# Patient Record
Sex: Female | Born: 2000 | Race: Black or African American | Hispanic: No | Marital: Single | Smoking: Never smoker
Health system: Southern US, Community
[De-identification: ages and names within clinical notes are randomized; demographics above are authoritative.]

## PROBLEM LIST (undated history)

## (undated) DIAGNOSIS — R51 Headache: Secondary | ICD-10-CM

## (undated) DIAGNOSIS — K2960 Other gastritis without bleeding: Secondary | ICD-10-CM

## (undated) DIAGNOSIS — E669 Obesity, unspecified: Secondary | ICD-10-CM

## (undated) DIAGNOSIS — Z9989 Dependence on other enabling machines and devices: Secondary | ICD-10-CM

## (undated) HISTORY — DX: Obesity, unspecified: E66.9

## (undated) HISTORY — DX: Headache: R51

## (undated) HISTORY — PX: NO PAST SURGERIES: SHX2092

## (undated) HISTORY — PX: MANIPULATION ANKLE: SUR840

## (undated) HISTORY — DX: Other gastritis without bleeding: K29.60

---
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
## 2020-12-04 ENCOUNTER — Other Ambulatory Visit: Payer: Medicaid Other

## 2020-12-12 ENCOUNTER — Other Ambulatory Visit: Payer: Medicaid Other

## 2020-12-12 DIAGNOSIS — Z20822 Contact with and (suspected) exposure to covid-19: Secondary | ICD-10-CM

## 2020-12-16 LAB — NOVEL CORONAVIRUS, NAA: SARS-CoV-2, NAA: NOT DETECTED

## 2021-09-05 ENCOUNTER — Emergency Department (HOSPITAL_COMMUNITY): Payer: Medicaid Other

## 2021-09-05 ENCOUNTER — Emergency Department (HOSPITAL_COMMUNITY)
Admission: EM | Admit: 2021-09-05 | Discharge: 2021-09-05 | Disposition: A | Discharge status: Home / Self Care | Payer: Medicaid Other | Attending: Emergency Medicine | Admitting: Emergency Medicine

## 2021-09-05 ENCOUNTER — Other Ambulatory Visit: Payer: Self-pay

## 2021-09-05 ENCOUNTER — Encounter (HOSPITAL_COMMUNITY): Payer: Self-pay | Admitting: Emergency Medicine

## 2021-09-05 DIAGNOSIS — W1839XA Other fall on same level, initial encounter: Secondary | ICD-10-CM | POA: Diagnosis not present

## 2021-09-05 DIAGNOSIS — S89301A Unspecified physeal fracture of lower end of right fibula, initial encounter for closed fracture: Secondary | ICD-10-CM | POA: Insufficient documentation

## 2021-09-05 DIAGNOSIS — Z91040 Latex allergy status: Secondary | ICD-10-CM | POA: Insufficient documentation

## 2021-09-05 DIAGNOSIS — Y929 Unspecified place or not applicable: Secondary | ICD-10-CM | POA: Insufficient documentation

## 2021-09-05 DIAGNOSIS — S9304XA Dislocation of right ankle joint, initial encounter: Secondary | ICD-10-CM | POA: Insufficient documentation

## 2021-09-05 DIAGNOSIS — Z79899 Other long term (current) drug therapy: Secondary | ICD-10-CM | POA: Diagnosis not present

## 2021-09-05 DIAGNOSIS — W19XXXA Unspecified fall, initial encounter: Secondary | ICD-10-CM

## 2021-09-05 DIAGNOSIS — S99911A Unspecified injury of right ankle, initial encounter: Secondary | ICD-10-CM | POA: Diagnosis present

## 2021-09-05 DIAGNOSIS — Y939 Activity, unspecified: Secondary | ICD-10-CM | POA: Diagnosis not present

## 2021-09-05 DIAGNOSIS — Y999 Unspecified external cause status: Secondary | ICD-10-CM | POA: Diagnosis not present

## 2021-09-05 DIAGNOSIS — S82831A Other fracture of upper and lower end of right fibula, initial encounter for closed fracture: Secondary | ICD-10-CM

## 2021-09-05 MED ORDER — PROPOFOL 10 MG/ML IV BOLUS
100.0000 mg | Freq: Once | INTRAVENOUS | Status: AC
  Administered 2021-09-05: 25 mg via INTRAVENOUS
  Filled 2021-09-05: qty 20

## 2021-09-05 MED ORDER — HYDROCODONE-ACETAMINOPHEN 5-325 MG PO TABS
1.0000 | ORAL_TABLET | Freq: Once | ORAL | Status: AC
  Administered 2021-09-05: 1 via ORAL
  Filled 2021-09-05: qty 1

## 2021-09-05 MED ORDER — OXYCODONE-ACETAMINOPHEN 5-325 MG PO TABS: 1.0000 | ORAL_TABLET | Freq: Four times a day (QID) | ORAL | 0 refills | Status: DC | PRN

## 2021-09-05 MED ORDER — MORPHINE SULFATE (PF) 4 MG/ML IV SOLN
4.0000 mg | Freq: Once | INTRAVENOUS | Status: AC
  Administered 2021-09-05: 4 mg via INTRAVENOUS
  Filled 2021-09-05: qty 1

## 2021-09-05 MED ORDER — PROPOFOL 10 MG/ML IV BOLUS
INTRAVENOUS | Status: AC | PRN
  Administered 2021-09-05 (×3): 25 mg via INTRAVENOUS

## 2021-09-05 MED ORDER — SODIUM CHLORIDE 0.9 % IV SOLN
INTRAVENOUS | Status: AC | PRN
  Administered 2021-09-05: 1000 mL via INTRAVENOUS

## 2021-09-05 MED ORDER — FREE SPIRIT KNEE/LEG WALKER MISC
0 refills | Status: DC
Start: 2021-09-05 — End: 2024-02-17

## 2021-09-05 MED ORDER — KETAMINE HCL 50 MG/5ML IJ SOSY
100.0000 mg | PREFILLED_SYRINGE | Freq: Once | INTRAMUSCULAR | Status: AC
  Administered 2021-09-05: 75 mg via INTRAVENOUS
  Filled 2021-09-05: qty 10

## 2021-09-05 MED ORDER — ONDANSETRON HCL 4 MG/2ML IJ SOLN
4.0000 mg | Freq: Once | INTRAMUSCULAR | Status: AC
  Administered 2021-09-05: 4 mg via INTRAVENOUS
  Filled 2021-09-05: qty 2

## 2021-09-05 MED ORDER — HYDROCODONE-ACETAMINOPHEN 5-325 MG PO TABS: 1.0000 | ORAL_TABLET | ORAL | 0 refills | Status: DC | PRN

## 2021-09-05 MED ORDER — ONDANSETRON 4 MG PO TBDP: 4.0000 mg | ORAL_TABLET | Freq: Three times a day (TID) | ORAL | 0 refills | Status: DC | PRN

## 2021-09-05 MED ORDER — MORPHINE SULFATE (PF) 4 MG/ML IV SOLN
4.0000 mg | Freq: Once | INTRAVENOUS | Status: AC
  Administered 2021-09-05: 4 mg via INTRAMUSCULAR
  Filled 2021-09-05: qty 1

## 2021-09-05 MED ORDER — KETAMINE HCL 10 MG/ML IJ SOLN
INTRAMUSCULAR | Status: AC | PRN
  Administered 2021-09-05: 25 mg via INTRAVENOUS

## 2021-09-05 MED ORDER — SODIUM CHLORIDE 0.9 % IV BOLUS
500.0000 mL | Freq: Once | INTRAVENOUS | Status: AC
  Administered 2021-09-05: 500 mL via INTRAVENOUS

## 2021-09-05 NOTE — ED Triage Notes (Signed)
Pt BIB [facility], c/o right ankle pain and swelling after a fall today.

## 2021-09-05 NOTE — Sedation Documentation (Signed)
Consent signed.

## 2021-09-05 NOTE — Sedation Documentation (Signed)
X-ray at bedside

## 2021-09-05 NOTE — Progress Notes (Signed)
Orthopedic Tech Progress Note Patient Details:  Beverly Hawkins 23-Apr-2001 [id number]  Applied short leg splint with stirrups to RLE after conscious sedation/reduction of Rt ankle. Britni, PA-C molded splint in desired position until fiberglass had setup.   Ortho Devices Type of Ortho Device: Stirrup splint, Post (short leg) splint Ortho Device/Splint Location: RLE Ortho Device/Splint Interventions: Ordered, Application, Adjustment   Post Interventions Patient Tolerated: Fair Instructions Provided: Care of device  Kal Chait Carmine Savoy 09/05/2021, 7:11 PM

## 2021-09-05 NOTE — Sedation Documentation (Signed)
Family updated as to patient's status.

## 2021-09-05 NOTE — Sedation Documentation (Signed)
ED Provider, RT, ortho, pharmacy, PA, and RN at bedside.

## 2021-09-05 NOTE — ED Provider Notes (Addendum)
[facility] EMERGENCY DEPARTMENT Provider Note   CSN: [id number] Arrival date & time: 09/05/21  1525    History Chief Complaint  Patient presents with   Ankle Pain    Beverly Hawkins is a 20 y.o. female with a past medical history who presents for ration of right ankle and right knee pain after fall.  Denies hitting head, LOC or anticoagulation.  Was getting dressed when she lost her balance felt a pop sensation to her right ankle.  Since she is unable to ambulate.  Pain located to her right lateral ankle as well as her knee.  She denies any proximal, midline tib-fib pain.  No paresthesias or weakness.  She has noted diffuse swelling.  Rates her pain a 10/10.  has not taken anything for symptoms.  Last p.o. intake 1 PM.  Denies chance of pregnancy.  History obtained from patient and past medical records.  No interpreter used  HPI     Past Medical History:  Diagnosis Date   Headache(784.0)    Obesity    Reflux gastritis     Patient Active Problem List   Diagnosis Date Noted   Migraine without aura and without status migrainosus, not intractable 05/01/2013   Morbid obesity (HCC) 05/01/2013    Past Surgical History:  Procedure Laterality Date   NO PAST SURGERIES       OB History   No obstetric history on file.     Family History  Problem Relation Age of Onset   Bipolar disorder Mother    Schizophrenia Paternal Grandmother     Social History   Tobacco Use   Smoking status: Never   Smokeless tobacco: Never  Substance Use Topics   Alcohol use: No   Drug use: No    Home Medications Prior to Admission medications   Medication Sig Start Date End Date Taking? Authorizing Provider  HYDROcodone-acetaminophen (NORCO/VICODIN) 5-325 MG tablet Take 1 tablet by mouth every 4 (four) hours as needed. 09/05/21  Yes Tanis Hensarling A, PA-C  Misc. Devices (FREE SPIRIT KNEE/LEG WALKER) MISC Use for ambulation 09/05/21  Yes Dorotea Hand A, PA-C   ondansetron (ZOFRAN ODT) 4 MG disintegrating tablet Take 1 tablet (4 mg total) by mouth every 8 (eight) hours as needed for nausea or vomiting. 09/05/21  Yes Ricke Kimoto A, PA-C  tobramycin (TOBREX) 0.3 % ophthalmic solution Place 1 drop every 6 (six) hours into the right eye. 10/22/17   Mardella Layman, MD    Allergies    Latex  Review of Systems   Review of Systems  Constitutional:  Negative for activity change, appetite change, chills, diaphoresis, fatigue and fever.  HENT: Negative.    Eyes:  Negative for pain and visual disturbance.  Respiratory: Negative.    Cardiovascular: Negative.   Gastrointestinal: Negative.   Genitourinary: Negative.   Musculoskeletal:        Right ankle pain  Skin: Negative.   Neurological: Negative.   All other systems reviewed and are negative.  Physical Exam Updated Vital Signs BP 120/72 (BP Location: Right Arm)   Pulse 77   Temp 98.8 F (37.1 C) (Oral)   Resp 16   Wt (!) 145.2 kg   SpO2 94%   Physical Exam Vitals and nursing note reviewed.  Constitutional:      General: She is not in acute distress.    Appearance: She is well-developed. She is not ill-appearing, toxic-appearing or diaphoretic.  HENT:     Head: Normocephalic and atraumatic.  Nose: Nose normal.     Mouth/Throat:     Mouth: Mucous membranes are moist.  Eyes:     Pupils: Pupils are equal, round, and reactive to light.  Cardiovascular:     Rate and Rhythm: Normal rate.     Pulses: Normal pulses.     Heart sounds: Normal heart sounds.  Pulmonary:     Effort: Pulmonary effort is normal. No respiratory distress.     Breath sounds: Normal breath sounds.  Abdominal:     General: Bowel sounds are normal. There is no distension.     Palpations: Abdomen is soft.  Musculoskeletal:        General: Normal range of motion.     Cervical back: Normal range of motion.     Comments: Obvious deformity to right ankle.  Wiggles toes without difficulty.  No midline, proximal  tib-fib pain.  Able to flex at her right knee.  Nontender.  No obvious patellar dislocation.  Pelvis stable, nontender.  Nontender femur  Skin:    General: Skin is warm and dry.     Capillary Refill: Capillary refill takes less than 2 seconds.     Comments: No erythema, warmth.  Diffuse swelling to right ankle.  Neurological:     General: No focal deficit present.     Mental Status: She is alert and oriented to person, place, and time.     Comments: Intact sensation right foot  Psychiatric:        Mood and Affect: Mood normal.   ED Results / Procedures / Treatments   Labs (all labs ordered are listed, but only abnormal results are displayed) Labs Reviewed - No data to display  EKG None  Radiology DG Ankle Complete Right  Result Date: 09/05/2021 CLINICAL DATA:  Status post reduction EXAM: RIGHT ANKLE - COMPLETE 3+ VIEW COMPARISON:  From earlier in the same day. FINDINGS: Oblique fracture is again identified through the distal fibula. The tibiotalar joint has been reduced. Generalized soft tissue swelling is noted. No new fracture is seen. IMPRESSION: Status post reduction of the tibiotalar dislocation. Persistent distal fibular fracture. Electronically Signed   By: Alcide Clever M.D.   On: 09/05/2021 19:15   DG Ankle Complete Right  Result Date: 09/05/2021 CLINICAL DATA:  Fall.  Pain. EXAM: RIGHT ANKLE - COMPLETE 3+ VIEW COMPARISON:  07/05/2011 FINDINGS: There is an oblique displaced fracture of the LATERAL malleolus. There is an acute dislocation of the tibiotalar joint. Posterior and MEDIAL malleoli are intact. Note is made of os trigonum. IMPRESSION: Acute fracture dislocation of the RIGHT ankle. Electronically Signed   By: Norva Pavlov M.D.   On: 09/05/2021 16:43   DG Knee Complete 4 Views Right  Result Date: 09/05/2021 CLINICAL DATA:  Fall EXAM: RIGHT KNEE - COMPLETE 4 VIEW COMPARISON:  None. FINDINGS: No evidence of fracture, dislocation, or joint effusion. No evidence of  arthropathy or other focal bone abnormality. Soft tissues are unremarkable. IMPRESSION: No acute osseous abnormality. Electronically Signed   By: Allegra Lai M.D.   On: 09/05/2021 16:43    Procedures Reduction of dislocation  Date/Time: 09/05/2021 7:00 PM Performed by: Linwood Dibbles, PA-C Authorized by: Linwood Dibbles, PA-C  Consent: Verbal consent obtained. Written consent obtained. Risks and benefits: risks, benefits and alternatives were discussed Consent given by: patient Patient understanding: patient states understanding of the procedure being performed Patient consent: the patient's understanding of the procedure matches consent given Procedure consent: procedure consent matches procedure scheduled Relevant documents:  relevant documents present and verified Test results: test results available and properly labeled Site marked: the operative site was marked Imaging studies: imaging studies available Required items: required blood products, implants, devices, and special equipment available Patient identity confirmed: verbally with patient and arm band Time out: Immediately prior to procedure a "time out" was called to verify the correct patient, procedure, equipment, support staff and site/side marked as required. Preparation: Patient was prepped and draped in the usual sterile fashion. Local anesthesia used: no  Anesthesia: Local anesthesia used: no  Sedation: Patient sedated: yes (See attending note for sedation)  Patient tolerance: patient tolerated the procedure well with no immediate complications   .Splint Application  Date/Time: 09/05/2021 7:00 PM Performed by: Ralph Leyden A, PA-C Authorized by: Linwood Dibbles, PA-C   Consent:    Consent obtained:  Verbal   Consent given by:  Patient   Risks, benefits, and alternatives were discussed: yes     Risks discussed:  Numbness, swelling, discoloration and pain   Alternatives discussed:  No  treatment, delayed treatment, alternative treatment, observation and referral Universal protocol:    Procedure explained and questions answered to patient or proxy's satisfaction: yes     Relevant documents present and verified: yes     Test results available: yes     Imaging studies available: yes     Required blood products, implants, devices, and special equipment available: yes     Site/side marked: yes     Immediately prior to procedure a time out was called: yes     Patient identity confirmed:  Verbally with patient and arm band Pre-procedure details:    Distal neurologic exam:  Normal   Distal perfusion: distal pulses strong and brisk capillary refill   Procedure details:    Location:  Ankle   Ankle location:  R ankle   Strapping: yes     Cast type:  Short leg   Splint type:  Short leg and ankle stirrup   Supplies:  Plaster, elastic bandage, fiberglass and cotton padding   Attestation: Splint applied and adjusted personally by me   Post-procedure details:    Distal neurologic exam:  Normal   Distal perfusion: distal pulses strong and brisk capillary refill     Procedure completion:  Tolerated well, no immediate complications   Post-procedure imaging: reviewed     Medications Ordered in ED Medications  morphine 4 MG/ML injection 4 mg (4 mg Intravenous Given 09/05/21 1727)  ondansetron (ZOFRAN) injection 4 mg (4 mg Intravenous Given 09/05/21 1725)  sodium chloride 0.9 % bolus 500 mL (0 mLs Intravenous Stopped 09/05/21 1824)  ketamine 50 mg in normal saline 5 mL (10 mg/mL) syringe (75 mg Intravenous Given 09/05/21 1831)  propofol (DIPRIVAN) 10 mg/mL bolus/IV push 100 mg (25 mg Intravenous Given 09/05/21 1833)  propofol (DIPRIVAN) 10 mg/mL bolus/IV push (25 mg Intravenous Given 09/05/21 1842)  0.9 %  sodium chloride infusion (0 mLs Intravenous Stopped 09/05/21 2030)  ketamine (KETALAR) injection (25 mg Intravenous Given 09/05/21 1847)  HYDROcodone-acetaminophen (NORCO/VICODIN) 5-325  MG per tablet 1 tablet (1 tablet Oral Given 09/05/21 2012)  morphine 4 MG/ML injection 4 mg (4 mg Intramuscular Given 09/05/21 2037)   ED Course  I have reviewed the triage vital signs and the nursing notes.  Pertinent labs & imaging results that were available during my care of the patient were reviewed by me and considered in my medical decision making (see chart for details).   Here for evaluation of fall.  She is afebrile, nonseptic, not ill-appearing.  Pain to primarily right ankle however some mild knee pain.  She is obvious deformity to ankle however is neurovascularly intact.  No evidence of open fractures.  Compartments soft however does have some moderate swelling to right ankle.  No obvious head injury.  No midline CTL tenderness.  X-ray here personally reviewed interpreted shows fracture/dislocation right ankle X-ray knee without any significant abnormality.  Nontender mid, proximal tib-fib.  Patient will need reduction, splinting, close follow-up with Ortho.  Successful reduction of dislocation.  See procedure note. See attending note for sedation documentation. Still has fibular fracture.  Neurovascularly intact after splint placed.  Patient was provided with crutches, pain management.  She will need to follow-up with orthopedics for definitive management.  I discussed this plan with patient and mother in room.  Also discussed return precautions.  Both voiced understanding.  The patient has been appropriately medically screened and/or stabilized in the ED. I have low suspicion for any other emergent medical condition which would require further screening, evaluation or treatment in the ED or require inpatient management.  Patient is hemodynamically stable and in no acute distress.  Patient able to ambulate in department prior to ED.  Evaluation does not show acute pathology that would require ongoing or additional emergent interventions while in the emergency department or further  inpatient treatment.  I have discussed the diagnosis with the patient and answered all questions.  Pain is been managed while in the emergency department and patient has no further complaints prior to discharge.  Patient is comfortable with plan discussed in room and is stable for discharge at this time.  I have discussed strict return precautions for returning to the emergency department.  Patient was encouraged to follow-up with PCP/specialist refer to at discharge.   ADDEND: Pharmacy did not have patients Rx in stock, wrote for different meds, canceled previous narcotic rx.     MDM Rules/Calculators/A&P                            Final Clinical Impression(s) / ED Diagnoses Final diagnoses:  Dislocation of right ankle joint, initial encounter  Fall, initial encounter  Closed fracture of distal end of right fibula, unspecified fracture morphology, initial encounter    Rx / DC Orders ED Discharge Orders          Ordered    oxyCODONE-acetaminophen (PERCOCET/ROXICET) 5-325 MG tablet  Every 6 hours PRN,   Status:  Discontinued        09/05/21 1940    ondansetron (ZOFRAN ODT) 4 MG disintegrating tablet  Every 8 hours PRN        09/05/21 1940    Misc. Devices (FREE SPIRIT KNEE/LEG WALKER) MISC        09/05/21 2017    HYDROcodone-acetaminophen (NORCO/VICODIN) 5-325 MG tablet  Every 4 hours PRN        09/05/21 2107             Shanie Mauzy A, PA-C 09/05/21 1948    Rick Carruthers A, PA-C 09/05/21 2108    Derwood Kaplan, MD 09/06/21 1420

## 2021-09-05 NOTE — ED Provider Notes (Signed)
Emergency Medicine Provider Triage Evaluation Note  Beverly Hawkins , a 20 y.o. female  was evaluated in triage.  Pt complains of right ankle and right knee pain after a fall.  She states that she was trying to put on spanks when she lost her balance and fell and felt a popping sensation to the right knee and right ankle.  She has been unable to ambulate since.  She rates her pain a 10/10 in severity.   Review of Systems  Positive:  Negative: See above   Physical Exam  There were no vitals taken for this visit. Gen:   Awake, no distress   Resp:  Normal effort  MSK:   Moves extremities without difficulty  Other:  Tenderness and swelling over the right ankle.  Dorsalis pedis pulses 2+ with Doppler  Medical Decision Making  Medically screening exam initiated at 3:28 PM.  Appropriate orders placed.  [name] was informed that the remainder of the evaluation will be completed by another provider, this initial triage assessment does not replace that evaluation, and the importance of remaining in the ED until their evaluation is complete.     Honor Loh [place], PA-C 09/05/21 1531    Derwood Kaplan, MD 09/06/21 1350

## 2021-09-05 NOTE — Discharge Instructions (Addendum)
It was our pleasure taking care of you here in the emergency department today  Your x-ray here showed that you have broken your fibula which is a bone on the outside of your ankle as well as dislocated your ankle.  Thankfully we were able to place your ankle back into place and reduce your dislocation.  You still have a fracture of your ankle.  As discussed in the room you will need to follow-up with orthopedics for this.  I have placed the contact information to the on-call orthopedic surgeon in your discharge paperwork.  Please call on Monday to schedule an appointment.  Let them know you were seen here in the emergency department as they typically will follow-up with you quicker.  I have written you for a short course of some pain medicine.  Please take as prescribed.  Do not drive or operate heavy machinery while taking this medication.  This medication may make you sleepy.  This medication is also narcotic only take as needed as this can become addictive.  Have also written you for some Zofran which is a nausea medicine as some pain medications make people nauseous.    Use caution as the Percocets have Tylenol in them as well.  Do not take any additional Tylenol while taking this medication.  You may take ibuprofen.  Do not take more than 2400 mg daily.  Make sure to ice and elevate your leg.  Some swelling is expected.  If you notice any numbness, tingling, severe increase in pain, color changes to your foot please seek reevaluation emergency department otherwise follow-up with orthopedics.

## 2021-09-06 NOTE — ED Provider Notes (Signed)
  Physical Exam  BP 120/72 (BP Location: Right Arm)   Pulse 77   Temp 98.8 F (37.1 C) (Oral)   Resp 16   Wt (!) 145.2 kg   SpO2 94%   Physical Exam  ED Course/Procedures     .Sedation  Date/Time: 09/05/2021 6:25 PM Performed by: Derwood Kaplan, MD Authorized by: Derwood Kaplan, MD   Consent:    Consent obtained:  Written   Consent given by:  Patient   Risks discussed:  Allergic reaction, prolonged hypoxia resulting in organ damage, dysrhythmia, prolonged sedation necessitating reversal, inadequate sedation, respiratory compromise necessitating ventilatory assistance and intubation, nausea and vomiting Universal protocol:    Procedure explained and questions answered to patient or proxy's satisfaction: yes     Relevant documents present and verified: yes     Test results available: yes     Imaging studies available: yes     Required blood products, implants, devices, and special equipment available: yes     Site/side marked: yes     Immediately prior to procedure, a time out was called: yes     Patient identity confirmed:  Arm band Indications:    Procedure performed:  Fracture reduction   Procedure necessitating sedation performed by:  Physician performing sedation Pre-sedation assessment:    Time since last food or drink:  5 hours   ASA classification: class 2 - patient with mild systemic disease     Mouth opening:  3 or more finger widths   Thyromental distance:  4 finger widths   Mallampati score:  II - soft palate, uvula, fauces visible   Neck mobility: normal     Pre-sedation assessments completed and reviewed: airway patency, cardiovascular function, hydration status, mental status, nausea/vomiting, pain level and respiratory function     Pre-sedation assessment completed:  09/05/2021 6:20 PM Immediate pre-procedure details:    Reassessment: Patient reassessed immediately prior to procedure     Reviewed: vital signs     Verified: bag valve mask available,  emergency equipment available, intubation equipment available, IV patency confirmed and oxygen available   Procedure details (see MAR for exact dosages):    Preoxygenation:  Room air   Sedation:  Ketamine and propofol   Intended level of sedation: deep   Intra-procedure monitoring:  Continuous capnometry, blood pressure monitoring, frequent LOC assessments, cardiac monitor, continuous pulse oximetry and frequent vital sign checks   Intra-procedure events: none     Total Provider sedation time (minutes):  30 Post-procedure details:    Post-sedation assessment completed:  09/05/2021 8:05 PM   Attendance: Constant attendance by certified staff until patient recovered     Recovery: Patient returned to pre-procedure baseline     Post-sedation assessments completed and reviewed: airway patency, cardiovascular function, mental status and respiratory function     Patient is stable for discharge or admission: yes     Procedure completion:  Tolerated well, no immediate complications  MDM   Patient was seen by me along with the APP.  I was present for the reduction of the ankle and sedation in its entirety.  X-rays were independently reviewed by me.  Post reduction films look satisfactory.        Derwood Kaplan, MD 09/06/21 [phone]

## 2021-09-09 ENCOUNTER — Encounter (HOSPITAL_COMMUNITY): Payer: Self-pay | Admitting: Orthopedic Surgery

## 2021-09-09 NOTE — Progress Notes (Signed)
DUE TO COVID-19 ONLY ONE VISITOR IS ALLOWED TO COME WITH YOU AND STAY IN THE WAITING ROOM ONLY DURING PRE OP AND PROCEDURE DAY OF SURGERY.   PCP -   Norva Riffle, PA-C Cardiologist - n/a  Chest x-ray - n/a EKG - n/a Stress Test - n/a ECHO - n/a Cardiac Cath - n/a  ICD Pacemaker/Loop - n/a  Sleep Study -  n/a CPAP - none  ERAS: Clear liquid til 8 am DOS.  Clear liquids reviewed with patient.  Anesthesia review: Yes  STOP now taking any Aspirin (unless otherwise instructed by your surgeon), Aleve, Naproxen, Ibuprofen, Motrin, Advil, Goody's, BC's, all herbal medications, fish oil, and all vitamins.   Coronavirus Screening Covid test n/a Ambulatory Surgery   Do you have any of the following symptoms:  Cough yes/no: No Fever (>100.95F)  yes/no: No Runny nose yes/no: No Sore throat yes/no: No Difficulty breathing/shortness of breath  yes/no: No  Have you traveled in the last 14 days and where? yes/no: No  Patient verbalized understanding of instructions that were given via phone.

## 2021-09-11 ENCOUNTER — Ambulatory Visit (HOSPITAL_COMMUNITY)
Admission: RE | Admit: 2021-09-11 | Discharge: 2021-09-11 | Disposition: A | Discharge status: Home / Self Care | Payer: Medicaid Other | Source: Ambulatory Visit | Attending: Orthopedic Surgery | Admitting: Orthopedic Surgery

## 2021-09-11 ENCOUNTER — Other Ambulatory Visit: Payer: Self-pay

## 2021-09-11 ENCOUNTER — Encounter (HOSPITAL_COMMUNITY): Payer: Self-pay | Admitting: Orthopedic Surgery

## 2021-09-11 ENCOUNTER — Ambulatory Visit (HOSPITAL_COMMUNITY): Payer: Medicaid Other | Admitting: Emergency Medicine

## 2021-09-11 ENCOUNTER — Encounter (HOSPITAL_COMMUNITY)
Admission: RE | Disposition: A | Discharge status: Home / Self Care | Payer: Self-pay | Source: Ambulatory Visit | Attending: Orthopedic Surgery

## 2021-09-11 ENCOUNTER — Ambulatory Visit (HOSPITAL_COMMUNITY): Payer: Medicaid Other

## 2021-09-11 DIAGNOSIS — Y9259 Other trade areas as the place of occurrence of the external cause: Secondary | ICD-10-CM | POA: Insufficient documentation

## 2021-09-11 DIAGNOSIS — S93401A Sprain of unspecified ligament of right ankle, initial encounter: Secondary | ICD-10-CM | POA: Insufficient documentation

## 2021-09-11 DIAGNOSIS — S82841A Displaced bimalleolar fracture of right lower leg, initial encounter for closed fracture: Secondary | ICD-10-CM | POA: Diagnosis not present

## 2021-09-11 DIAGNOSIS — X501XXA Overexertion from prolonged static or awkward postures, initial encounter: Secondary | ICD-10-CM | POA: Diagnosis not present

## 2021-09-11 DIAGNOSIS — W19XXXA Unspecified fall, initial encounter: Secondary | ICD-10-CM | POA: Insufficient documentation

## 2021-09-11 DIAGNOSIS — E669 Obesity, unspecified: Secondary | ICD-10-CM | POA: Insufficient documentation

## 2021-09-11 DIAGNOSIS — Z419 Encounter for procedure for purposes other than remedying health state, unspecified: Secondary | ICD-10-CM

## 2021-09-11 DIAGNOSIS — Z6841 Body Mass Index (BMI) 40.0 and over, adult: Secondary | ICD-10-CM | POA: Diagnosis not present

## 2021-09-11 DIAGNOSIS — T148XXA Other injury of unspecified body region, initial encounter: Secondary | ICD-10-CM

## 2021-09-11 HISTORY — PX: ORIF ANKLE FRACTURE: SHX5408

## 2021-09-11 HISTORY — DX: Dependence on other enabling machines and devices: Z99.89

## 2021-09-11 LAB — CBC
HCT: 41.6 % (ref 36.0–46.0)
Hemoglobin: 12.9 g/dL (ref 12.0–15.0)
MCH: 24.8 pg — ABNORMAL LOW (ref 26.0–34.0)
MCHC: 31 g/dL (ref 30.0–36.0)
MCV: 79.8 fL — ABNORMAL LOW (ref 80.0–100.0)
Platelets: 303 10*3/uL (ref 150–400)
RBC: 5.21 MIL/uL — ABNORMAL HIGH (ref 3.87–5.11)
RDW: 15.6 % — ABNORMAL HIGH (ref 11.5–15.5)
WBC: 8.5 10*3/uL (ref 4.0–10.5)
nRBC: 0 % (ref 0.0–0.2)

## 2021-09-11 LAB — POCT PREGNANCY, URINE: Preg Test, Ur: NEGATIVE

## 2021-09-11 SURGERY — OPEN REDUCTION INTERNAL FIXATION (ORIF) ANKLE FRACTURE
Anesthesia: Regional | Site: Ankle | Laterality: Right

## 2021-09-11 MED ORDER — MIDAZOLAM HCL 2 MG/2ML IJ SOLN
INTRAMUSCULAR | Status: AC
  Filled 2021-09-11: qty 2

## 2021-09-11 MED ORDER — HYDROMORPHONE HCL 1 MG/ML IJ SOLN
INTRAMUSCULAR | Status: DC | PRN
  Administered 2021-09-11: .5 mg via INTRAVENOUS

## 2021-09-11 MED ORDER — DEXAMETHASONE SODIUM PHOSPHATE 4 MG/ML IJ SOLN
INTRAMUSCULAR | Status: DC | PRN
  Administered 2021-09-11: 5 mg via INTRAVENOUS

## 2021-09-11 MED ORDER — ORAL CARE MOUTH RINSE: 15.0000 mL | Freq: Once | OROMUCOSAL | Status: AC

## 2021-09-11 MED ORDER — FENTANYL CITRATE (PF) 250 MCG/5ML IJ SOLN
INTRAMUSCULAR | Status: DC | PRN
  Administered 2021-09-11 (×3): 25 ug via INTRAVENOUS
  Administered 2021-09-11: 50 ug via INTRAVENOUS
  Administered 2021-09-11 (×5): 25 ug via INTRAVENOUS

## 2021-09-11 MED ORDER — HYDROMORPHONE HCL 1 MG/ML IJ SOLN
INTRAMUSCULAR | Status: AC
  Filled 2021-09-11: qty 0.5

## 2021-09-11 MED ORDER — 0.9 % SODIUM CHLORIDE (POUR BTL) OPTIME
TOPICAL | Status: DC | PRN
  Administered 2021-09-11: 300 mL

## 2021-09-11 MED ORDER — FENTANYL CITRATE (PF) 100 MCG/2ML IJ SOLN: 100.0000 ug | Freq: Once | INTRAMUSCULAR | Status: AC

## 2021-09-11 MED ORDER — BUPIVACAINE HCL (PF) 0.5 % IJ SOLN
INTRAMUSCULAR | Status: AC
  Filled 2021-09-11: qty 30

## 2021-09-11 MED ORDER — CHLORHEXIDINE GLUCONATE 0.12 % MT SOLN
15.0000 mL | Freq: Once | OROMUCOSAL | Status: AC
  Administered 2021-09-11: 15 mL via OROMUCOSAL
  Filled 2021-09-11: qty 15

## 2021-09-11 MED ORDER — BUPIVACAINE HCL (PF) 0.5 % IJ SOLN
INTRAMUSCULAR | Status: DC | PRN
  Administered 2021-09-11: 20 mL via PERINEURAL

## 2021-09-11 MED ORDER — ONDANSETRON HCL 4 MG/2ML IJ SOLN
INTRAMUSCULAR | Status: DC | PRN
  Administered 2021-09-11: 4 mg via INTRAVENOUS

## 2021-09-11 MED ORDER — PROPOFOL 10 MG/ML IV BOLUS
INTRAVENOUS | Status: DC | PRN
  Administered 2021-09-11: 200 mg via INTRAVENOUS

## 2021-09-11 MED ORDER — OXYCODONE HCL 5 MG PO TABS
5.0000 mg | ORAL_TABLET | Freq: Once | ORAL | Status: AC
  Administered 2021-09-11: 5 mg via ORAL

## 2021-09-11 MED ORDER — CEFAZOLIN IN SODIUM CHLORIDE 3-0.9 GM/100ML-% IV SOLN
3.0000 g | INTRAVENOUS | Status: AC
  Administered 2021-09-11: 3 g via INTRAVENOUS
  Filled 2021-09-11: qty 100

## 2021-09-11 MED ORDER — ACETAMINOPHEN 500 MG PO TABS
1000.0000 mg | ORAL_TABLET | Freq: Once | ORAL | Status: DC
  Filled 2021-09-11: qty 2

## 2021-09-11 MED ORDER — KETAMINE HCL 10 MG/ML IJ SOLN
INTRAMUSCULAR | Status: DC | PRN
  Administered 2021-09-11: 10 mg via INTRAVENOUS
  Administered 2021-09-11 (×2): 20 mg via INTRAVENOUS

## 2021-09-11 MED ORDER — OXYCODONE HCL 5 MG PO TABS
ORAL_TABLET | ORAL | Status: AC
  Filled 2021-09-11: qty 1

## 2021-09-11 MED ORDER — BUPIVACAINE HCL (PF) 0.5 % IJ SOLN
INTRAMUSCULAR | Status: DC | PRN
  Administered 2021-09-11: 15 mL

## 2021-09-11 MED ORDER — MIDAZOLAM HCL 2 MG/2ML IJ SOLN
INTRAMUSCULAR | Status: AC
  Administered 2021-09-11: 2 mg via INTRAVENOUS
  Filled 2021-09-11: qty 2

## 2021-09-11 MED ORDER — DEXAMETHASONE SODIUM PHOSPHATE 10 MG/ML IJ SOLN
INTRAMUSCULAR | Status: DC | PRN
  Administered 2021-09-11: 5 mg via INTRAVENOUS

## 2021-09-11 MED ORDER — FENTANYL CITRATE (PF) 100 MCG/2ML IJ SOLN
25.0000 ug | INTRAMUSCULAR | Status: DC | PRN
  Administered 2021-09-11 (×2): 50 ug via INTRAVENOUS

## 2021-09-11 MED ORDER — PROPOFOL 10 MG/ML IV BOLUS
INTRAVENOUS | Status: AC
  Filled 2021-09-11: qty 20

## 2021-09-11 MED ORDER — MIDAZOLAM HCL 2 MG/2ML IJ SOLN: 2.0000 mg | Freq: Once | INTRAMUSCULAR | Status: AC

## 2021-09-11 MED ORDER — GLYCOPYRROLATE PF 0.2 MG/ML IJ SOSY
PREFILLED_SYRINGE | INTRAMUSCULAR | Status: DC | PRN
  Administered 2021-09-11: .1 mg via INTRAVENOUS

## 2021-09-11 MED ORDER — FENTANYL CITRATE (PF) 100 MCG/2ML IJ SOLN
INTRAMUSCULAR | Status: AC
  Administered 2021-09-11: 100 ug via INTRAVENOUS
  Filled 2021-09-11: qty 2

## 2021-09-11 MED ORDER — BUPIVACAINE LIPOSOME 1.3 % IJ SUSP
INTRAMUSCULAR | Status: DC | PRN
  Administered 2021-09-11: 10 mL via PERINEURAL

## 2021-09-11 MED ORDER — ASPIRIN EC 81 MG PO TBEC: 81.0000 mg | DELAYED_RELEASE_TABLET | Freq: Two times a day (BID) | ORAL | 0 refills | Status: AC

## 2021-09-11 MED ORDER — KETAMINE HCL 50 MG/5ML IJ SOSY
PREFILLED_SYRINGE | INTRAMUSCULAR | Status: AC
  Filled 2021-09-11: qty 5

## 2021-09-11 MED ORDER — LACTATED RINGERS IV SOLN: INTRAVENOUS | Status: DC

## 2021-09-11 MED ORDER — LIDOCAINE 2% (20 MG/ML) 5 ML SYRINGE
INTRAMUSCULAR | Status: DC | PRN
  Administered 2021-09-11: 20 mg via INTRAVENOUS

## 2021-09-11 MED ORDER — OXYCODONE HCL 5 MG PO TABS: 5.0000 mg | ORAL_TABLET | ORAL | 0 refills | Status: AC | PRN

## 2021-09-11 MED ORDER — FENTANYL CITRATE (PF) 100 MCG/2ML IJ SOLN
INTRAMUSCULAR | Status: AC
  Filled 2021-09-11: qty 2

## 2021-09-11 MED ORDER — FENTANYL CITRATE (PF) 250 MCG/5ML IJ SOLN
INTRAMUSCULAR | Status: AC
  Filled 2021-09-11: qty 5

## 2021-09-11 SURGICAL SUPPLY — 88 items
BAG COUNTER SPONGE SURGICOUNT (BAG) ×2 IMPLANT
BAG SPNG CNTER NS LX DISP (BAG) ×1
BANDAGE ESMARK 6X9 LF (GAUZE/BANDAGES/DRESSINGS) ×1 IMPLANT
BIT DRILL 2 CANN GRADUATED (BIT) ×1 IMPLANT
BIT DRILL 2.5 CANN LNG (BIT) ×1 IMPLANT
BIT DRILL 2.5 CANN STRL (BIT) ×1 IMPLANT
BIT DRILL 2.7 (BIT) ×2
BIT DRILL 2.7X2.7/3XSCR ANKL (BIT) IMPLANT
BIT DRL 2.7X2.7/3XSCR ANKL (BIT) ×1
BNDG CMPR 9X6 STRL LF SNTH (GAUZE/BANDAGES/DRESSINGS)
BNDG CMPR MED 15X6 ELC VLCR LF (GAUZE/BANDAGES/DRESSINGS) ×1
BNDG ELASTIC 4X5.8 VLCR STR LF (GAUZE/BANDAGES/DRESSINGS) ×3 IMPLANT
BNDG ELASTIC 6X15 VLCR STRL LF (GAUZE/BANDAGES/DRESSINGS) ×1 IMPLANT
BNDG ESMARK 6X9 LF (GAUZE/BANDAGES/DRESSINGS)
CANISTER SUCT 3000ML PPV (MISCELLANEOUS) ×2 IMPLANT
COVER SURGICAL LIGHT HANDLE (MISCELLANEOUS) ×2 IMPLANT
CUFF TOURN SGL QUICK 34 (TOURNIQUET CUFF)
CUFF TOURN SGL QUICK 42 (TOURNIQUET CUFF) ×1 IMPLANT
CUFF TRNQT CYL 34X4.125X (TOURNIQUET CUFF) IMPLANT
DRAPE C-ARM 42X72 X-RAY (DRAPES) ×1 IMPLANT
DRAPE C-ARMOR (DRAPES) ×1 IMPLANT
DRAPE OEC MINIVIEW 54X84 (DRAPES) ×1 IMPLANT
DRAPE ORTHO SPLIT 77X108 STRL (DRAPES)
DRAPE SURG ORHT 6 SPLT 77X108 (DRAPES) IMPLANT
DRAPE U-SHAPE 47X51 STRL (DRAPES) ×1 IMPLANT
DRSG PAD ABDOMINAL 8X10 ST (GAUZE/BANDAGES/DRESSINGS) ×2 IMPLANT
DURAPREP 26ML APPLICATOR (WOUND CARE) ×1 IMPLANT
ELECT REM PT RETURN 9FT ADLT (ELECTROSURGICAL) ×2
ELECTRODE REM PT RTRN 9FT ADLT (ELECTROSURGICAL) ×1 IMPLANT
GAUZE SPONGE 4X4 12PLY STRL (GAUZE/BANDAGES/DRESSINGS) ×2 IMPLANT
GLOVE SRG 8 PF TXTR STRL LF DI (GLOVE) ×1 IMPLANT
GLOVE SURG ENC MOIS LTX SZ7.5 (GLOVE) ×4 IMPLANT
GLOVE SURG SYN 7.5  E (GLOVE) ×2
GLOVE SURG SYN 7.5 E (GLOVE) ×1 IMPLANT
GLOVE SURG SYN 7.5 PF PI (GLOVE) ×1 IMPLANT
GLOVE SURG UNDER POLY LF SZ7.5 (GLOVE) ×2 IMPLANT
GLOVE SURG UNDER POLY LF SZ8 (GLOVE) ×2
GOWN STRL REUS W/ TWL LRG LVL3 (GOWN DISPOSABLE) ×2 IMPLANT
GOWN STRL REUS W/ TWL XL LVL3 (GOWN DISPOSABLE) ×2 IMPLANT
GOWN STRL REUS W/TWL LRG LVL3 (GOWN DISPOSABLE) ×4
GOWN STRL REUS W/TWL XL LVL3 (GOWN DISPOSABLE) ×4
IMPL TIGHTROP W/DRV K-LESS (Anchor) IMPLANT
IMPLANT TIGHTROPE W/DRV K-LESS (Anchor) ×2 IMPLANT
K-WIRE BB-TAK (WIRE) ×4
KIT BASIN OR (CUSTOM PROCEDURE TRAY) ×2 IMPLANT
KIT TURNOVER KIT B (KITS) ×2 IMPLANT
KWIRE BB-TAK (WIRE) IMPLANT
MANIFOLD NEPTUNE II (INSTRUMENTS) ×2 IMPLANT
NEEDLE 22X1 1/2 (OR ONLY) (NEEDLE) ×2 IMPLANT
NS IRRIG 1000ML POUR BTL (IV SOLUTION) ×2 IMPLANT
PACK ORTHO EXTREMITY (CUSTOM PROCEDURE TRAY) ×2 IMPLANT
PAD ARMBOARD 7.5X6 YLW CONV (MISCELLANEOUS) ×4 IMPLANT
PAD CAST 4YDX4 CTTN HI CHSV (CAST SUPPLIES) ×2 IMPLANT
PADDING CAST ABS 4INX4YD NS (CAST SUPPLIES) ×4
PADDING CAST ABS COTTON 4X4 ST (CAST SUPPLIES) IMPLANT
PADDING CAST COTTON 4X4 STRL (CAST SUPPLIES) ×2
PADDING CAST COTTON 6X4 STRL (CAST SUPPLIES) ×1 IMPLANT
PLATE FIBULA 6 HOLE RT LOCK (Plate) ×1 IMPLANT
SCREW BN T10 FT 20X2.7XST CORT (Screw) IMPLANT
SCREW CORT 2.7X20 (Screw) ×4 IMPLANT
SCREW CORTEX 2.7X24MM LP SS (Screw) ×1 IMPLANT
SCREW LOCK T15 FT 14X3.5XST (Screw) IMPLANT
SCREW LOCKING 2.7X12 ANKLE (Screw) ×1 IMPLANT
SCREW LOCKING 2.7X14MM (Screw) ×2 IMPLANT
SCREW LOCKING 3.5X14MM (Screw) ×4 IMPLANT
SCREW LOW PROFILE 3.5X14 (Screw) ×2 IMPLANT
SCREW TM SS 2.7X14 CORTEX (Screw) ×1 IMPLANT
SPLINT J IMMOBILIZER 4X20FT (CAST SUPPLIES) IMPLANT
SPLINT J PLASTER J 4INX20Y (CAST SUPPLIES) ×3
SPONGE T-LAP 18X18 ~~LOC~~+RFID (SPONGE) ×2 IMPLANT
STRIP CLOSURE SKIN 1/2X4 (GAUZE/BANDAGES/DRESSINGS) ×1 IMPLANT
SUCTION FRAZIER HANDLE 10FR (MISCELLANEOUS) ×2
SUCTION TUBE FRAZIER 10FR DISP (MISCELLANEOUS) ×1 IMPLANT
SUT ETHILON 3 0 FSL (SUTURE) ×1 IMPLANT
SUT MNCRL AB 4-0 PS2 18 (SUTURE) ×1 IMPLANT
SUT MON AB 2-0 CT1 27 (SUTURE) IMPLANT
SUT VIC AB 0 CT1 27 (SUTURE) ×2
SUT VIC AB 0 CT1 27XBRD ANBCTR (SUTURE) IMPLANT
SUT VIC AB 2-0 SH 27 (SUTURE) ×4
SUT VIC AB 2-0 SH 27XBRD (SUTURE) IMPLANT
SYR BULB IRRIG 60ML STRL (SYRINGE) ×2 IMPLANT
SYR CONTROL 10ML LL (SYRINGE) ×1 IMPLANT
TOWEL GREEN STERILE (TOWEL DISPOSABLE) ×2 IMPLANT
TOWEL GREEN STERILE FF (TOWEL DISPOSABLE) ×2 IMPLANT
TUBE CONNECTING 12X1/4 (SUCTIONS) ×2 IMPLANT
UNDERPAD 30X36 HEAVY ABSORB (UNDERPADS AND DIAPERS) ×4 IMPLANT
WATER STERILE IRR 1000ML POUR (IV SOLUTION) ×1 IMPLANT
YANKAUER SUCT BULB TIP NO VENT (SUCTIONS) ×1 IMPLANT

## 2021-09-11 NOTE — Op Note (Signed)
09/11/2021  PATIENT:  Beverly Hawkins    PRE-OPERATIVE DIAGNOSIS:  RIGHT ANKLE FRACTURE  POST-OPERATIVE DIAGNOSIS: Functional bimalleolar ankle fracture with syndesmotic injury  PROCEDURE:  OPEN REDUCTION INTERNAL FIXATION (ORIF) ANKLE FRACTURE  SURGEON:  Alethia Melendrez A Venda Dice, MD  PHYSICIAN ASSISTANT: none ANESTHESIA:   General  ESTIMATED BLOOD LOSS: 50cc  PREOPERATIVE INDICATIONS:  Beverly Hawkins is a  20 y.o. female with a diagnosis of RIGHT ANKLE FRACTURE who elected for surgical management to minimize the risk for malunion and nonunion and post-traumatic arthritis.    The risks benefits and alternatives were discussed with the patient preoperatively including but not limited to the risks of infection, bleeding, nerve injury, cardiopulmonary complications, the need for revision surgery, the need for hardware removal, among others, and the patient was willing to proceed.  OPERATIVE IMPLANTS:  Implant Name Type Inv. Item Serial No. Manufacturer Lot No. LRB No. Used Action  SCREW CORT 2.7X20 - [id number] Screw SCREW CORT 2.7X20  [employer] ON STERILE TRAY Right 2 Implanted  SCREW LOW PROFILE 3.5X14 - [id number] Screw SCREW LOW PROFILE 3.5X14  [employer] ON STERILE TRAY Right 2 Implanted  PLATE FIBULA 6 HOLE RT LOCK - [id number] Plate PLATE FIBULA 6 HOLE RT LOCK  [employer] ON STERILE TRAY Right 1 Implanted  SCREW LOCKING 3.5X14MM - [id number] Screw SCREW LOCKING 3.5X14MM  [employer] ON STERILE TRAY Right 2 Implanted  SCREW LOCKING 2.7X14MM - [id number] Screw SCREW LOCKING 2.7X14MM  [employer] ON STERILE TRAY Right 2 Implanted  SCREW LOCKING 2.7X12 ANKLE - [id number] Screw SCREW LOCKING 2.7X12 ANKLE  [employer] ON STERILE TRAY Right 1 Implanted  IMPLANT TIGHTROPE W/DRV K-LESS - [id number] Anchor IMPLANT TIGHTROPE W/DRV [employer] [id number] Right 1 Implanted    OPERATIVE PROCEDURE: The patient was brought to the operating room and placed in the supine position. All bony prominences  were padded. Non sterile Tourniquet was placed but not inflated. General anesthesia was administered. The lower extremity was prepped and draped in the usual sterile fashion.  Time out was performed.   Incision was made over the distal fibula and the fracture was exposed and reduced anatomically with a clamp. A lag screw was placed anterior to posterior across the fracture.  Did not get a great bite with the first screw so a second lag screw was also placed with this had better fixation and compression of the fracture.  I then applied a distal fibular locking plate and secured the plate proximally with 2 nonlocking screws.  Given the patient's size and bone quality I also used 2 locking screws at the proximal most aspect of the plate.  Distally the plate was secured with 5 unicortical nonlocking screws.  Following fixation of the fibula there was still notable widening of the syndesmosis with no overlap of the tibia and fibula on the mortise view concerning for a syndesmotic disruption.  We elected to use a tight rope to fix his syndesmosis.  A periarticular clamp was placed across the leg anterior medial to posterior lateral with moderate compression until be the stenosis was adequately reduced.  We then placed a tight rope lateral to medial through the plate just proximal to the fracture.  I used c-arm to confirm satisfactory reduction and fixation.   The wounds were irrigated, and closed with 0  vicryl, 2-0 vicryl with nylon closure for the skin. The wounds were injected with local anesthetic. Sterile gauze was applied followed by a short leg splint. She was awakened  and returned to the PACU in stable and satisfactory condition. There were no complications.    Post op recs: WB: NWB RLE in splint until follow up Imaging: PACU xrays DVT prophylaxis: Aspirin 81mg  BID starting POD1 x4 weeks Follow up: 2 weeks after surgery for a wound check with Dr. at [facility].  Address: [address], [place], [place]  Office Phone: [phone]   [phone], MD Orthopaedic Surgery

## 2021-09-11 NOTE — Anesthesia Procedure Notes (Signed)
Procedure Name: Intubation Date/Time: 09/11/2021 11:45 AM Performed by: Phoebe Perch, CRNA Pre-anesthesia Checklist: Patient identified, Emergency Drugs available, Suction available and Patient being monitored Patient Re-evaluated:Patient Re-evaluated prior to induction Oxygen Delivery Method: Circle system utilized Preoxygenation: Pre-oxygenation with 100% oxygen Induction Type: IV induction Ventilation: Mask ventilation without difficulty LMA: LMA inserted LMA Size: 4.0 Tube type: Oral Number of attempts: 1 Airway Equipment and Method: Stylet and Oral airway Placement Confirmation: ETT inserted through vocal cords under direct vision, positive ETCO2 and breath sounds checked- equal and bilateral Tube secured with: Tape Dental Injury: Teeth and Oropharynx as per pre-operative assessment

## 2021-09-11 NOTE — Transfer of Care (Signed)
Immediate Anesthesia Transfer of Care Note  Patient: Beverly Hawkins  Procedure(s) Performed: OPEN REDUCTION INTERNAL FIXATION (ORIF) ANKLE FRACTURE (Right: Ankle)  Patient Location: PACU  Anesthesia Type:General  Level of Consciousness: awake  Airway & Oxygen Therapy: Patient Spontanous Breathing and Patient connected to face mask oxygen  Post-op Assessment: Report given to RN and Post -op Vital signs reviewed and stable  Post vital signs: stable  Last Vitals:  Vitals Value Taken Time  BP 151/137 09/11/21 1351  Temp    Pulse 99 09/11/21 1354  Resp 20 09/11/21 1354  SpO2 100 % 09/11/21 1354  Vitals shown include unvalidated device data.  Last Pain:  Vitals:   09/11/21 1004  TempSrc:   PainSc: 4          Complications: No notable events documented.

## 2021-09-11 NOTE — H&P (Signed)
SUBJECTIVE:   Right ankle injury.  She is a 20 year old female who injured her ankle on 09/05/2021.  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  She was unable to weight bear.  She was seen in the emergency department where x-rays demonstrated an ankle fracture and dislocation.  She was successfully closed reduced and splinted.  No skin injury was noted.  She also reports some mild pain in the right knee.  Otherwise no pain in the hip or back.  She was given hydrocodone, which she has been taking for pain.   Past medical history: Obesity.  No other medical problems.   Current medications:  She does not take other medications.  Social history:  Nonsmoker.  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   Allergies:  No known drug allergies.    OBJECTIVE: Height 5 feet 5 inches, weight 326 pounds.  BMI is 54.  She is unable to ambulate.  She has a short-leg splint on the right lower extremity that is clean, dry and intact.  Her exposed toes are warm and well perfused.  Intact sensation distally.  Unable to demonstrate intact active EHL and FHL.  She has mild tenderness to the right knee, but no appreciable effusion.  Range of motion is 0 to 100.  No pain with hip range of motion.    X-RAYS: New x-rays obtained today in the clinic demonstrate displaced lateral malleolus fracture, as well as medial joint space widening.  Tibiotalar joint is otherwise reduced.    IMPRESSION:  Functional bimalleolar ankle fracture of the right ankle.    PLAN: We discussed with the patient that she has an unstable ankle injury and would benefit from open reduction and internal fixation.  Surgical plan, risks and benefits, postoperative recovery were discussed with the patient.  Risks including infection, nerve injury and bleeding were discussed.  We will plan to do her surgery as an outpatient.  She will be nonweightbearing in a splint postoperatively for  at least two weeks.  She expressed understanding and agreement

## 2021-09-11 NOTE — Anesthesia Procedure Notes (Signed)
Anesthesia Regional Block: Popliteal block   Pre-Anesthetic Checklist: , timeout performed,  Correct Patient, Correct Site, Correct Laterality,  Correct Procedure, Correct Position, site marked,  Risks and benefits discussed,  Surgical consent,  Pre-op evaluation,  At surgeon's request and post-op pain management  Laterality: Right  Prep: Maximum Sterile Barrier Precautions used, chloraprep       Needles:  Injection technique: Single-shot  Needle Type: Echogenic Stimulator Needle     Needle Length: 9cm  Needle Gauge: 22     Additional Needles:   Procedures:,,,, ultrasound used (permanent image in chart),,    Narrative:  Start time: 09/11/2021 11:00 AM End time: 09/11/2021 11:15 AM Injection made incrementally with aspirations every 5 mL.  Performed by: Personally  Anesthesiologist: Elmer Picker, MD  Additional Notes: Monitors applied. No increased pain on injection. No increased resistance to injection. Injection made in 5cc increments. Good needle visualization. Patient tolerated procedure well.

## 2021-09-11 NOTE — Interval H&P Note (Signed)
The risks benefits and alternatives were discussed with the patient including but not limited to the risks of nonoperative treatment, versus surgical intervention including infection, bleeding, nerve injury, malunion, nonunion, the need for revision surgery, hardware prominence, hardware failure, the need for hardware removal, blood clots, cardiopulmonary complications, morbidity, mortality, among others, and they were willing to proceed.  Consent was signed by myself and the patient.  Right leg was marked.      

## 2021-09-11 NOTE — Anesthesia Preprocedure Evaluation (Addendum)
Anesthesia Evaluation  Patient identified by MRN, date of birth, ID band Patient awake    Reviewed: Allergy & Precautions, NPO status , Patient's Chart, lab work & pertinent test results  Airway Mallampati: III  TM Distance: >3 FB Neck ROM: Full    Dental no notable dental hx. (+) Teeth Intact, Dental Advisory Given   Pulmonary neg pulmonary ROS,    Pulmonary exam normal breath sounds clear to auscultation       Cardiovascular negative cardio ROS Normal cardiovascular exam Rhythm:Regular Rate:Normal     Neuro/Psych  Headaches, negative psych ROS   GI/Hepatic Neg liver ROS, GERD  ,  Endo/Other  Morbid obesity (BMI 52)  Renal/GU negative Renal ROS  negative genitourinary   Musculoskeletal negative musculoskeletal ROS (+)   Abdominal   Peds  Hematology negative hematology ROS (+)   Anesthesia Other Findings   Reproductive/Obstetrics                            Anesthesia Physical Anesthesia Plan  ASA: 3  Anesthesia Plan: General and Regional   Post-op Pain Management:  Regional for Post-op pain   Induction: Intravenous  PONV Risk Score and Plan: 3 and Ondansetron, Dexamethasone and Midazolam  Airway Management Planned: LMA  Additional Equipment:   Intra-op Plan:   Post-operative Plan: Extubation in OR  Informed Consent: I have reviewed the patients History and Physical, chart, labs and discussed the procedure including the risks, benefits and alternatives for the proposed anesthesia with the patient or authorized representative who has indicated his/her understanding and acceptance.     Dental advisory given  Plan Discussed with: CRNA  Anesthesia Plan Comments:         Anesthesia Quick Evaluation

## 2021-09-11 NOTE — Discharge Instructions (Signed)
Diet: As you were doing prior to hospitalization   Shower:   If you have a splint on, leave the splint in place and keep the splint dry with a plastic bag.  Dressing:  If you have a splint, then just leave the splint in place and we will change your bandages during your first follow-up appointment.  If water gets in the splint or the splint gets saturated please call the clinic and we can see you to change your splint.   Activity:  Increase activity slowly as tolerated, but follow the weight bearing instructions below.  The rules on driving is that you can not be taking narcotics while you drive, and you must feel in control of the vehicle.    Weight Bearing:   no weight on the right leg.    Blood clot prevention (DVT Prophylaxis): After surgery you are at an increased risk for a blood clot. you were prescribed a blood thinner, Aspirin 81mg , to be taken twice daily for a total of 4 weeks from surgery to help reduce your risk of getting a blood clot. This will help prevent a blood clot. Signs of a pulmonary embolus (blood clot in the lungs) include sudden short of breath, feeling lightheaded or dizzy, chest pain with a deep breath, rapid pulse rapid breathing. Signs of a blood clot in your arms or legs include new unexplained swelling and cramping, warm, red or darkened skin around the painful area. Please call the office or 911 right away if these signs or symptoms develop.  To prevent constipation: you may use a stool softener such as -  Colace (over the counter) 100 mg by mouth twice a day  Drink plenty of fluids (prune juice may be helpful) and high fiber foods Miralax (over the counter) for constipation as needed.    Itching:  If you experience itching with your medications, try taking only a single pain pill, or even half a pain pill at a time.  You may take up to 10 pain pills per day, and you can also use benadryl over the counter for itching or also to help with sleep.   Precautions:  If  you experience chest pain or shortness of breath - call 911 immediately for transfer to the hospital emergency department!!   Call office [phone]) for the following: Temperature greater than 101F Persistent nausea and vomiting Severe uncontrolled pain Redness, tenderness, or signs of infection (pain, swelling, redness, odor or green/yellow discharge around the site) Difficulty breathing, headache or visual disturbances Hives Persistent dizziness or light-headedness Extreme fatigue Any other questions or concerns you may have after discharge  In an emergency, call 911 or go to an Emergency Department at a nearby hospital                                                Follow- Up Appointment:  Please call for an appointment to be seen in 2 weeks [facility] with your surgeon [facility] - [phone]

## 2021-09-13 LAB — NASOPHARYNGEAL CULTURE

## 2021-09-14 ENCOUNTER — Encounter (HOSPITAL_COMMUNITY): Payer: Self-pay | Admitting: Orthopedic Surgery

## 2021-09-14 NOTE — Anesthesia Postprocedure Evaluation (Signed)
Anesthesia Post Note  Patient: Beverly Hawkins  Procedure(s) Performed: OPEN REDUCTION INTERNAL FIXATION (ORIF) ANKLE FRACTURE (Right: Ankle)     Patient location during evaluation: PACU Anesthesia Type: Regional and General Level of consciousness: awake and alert Pain management: pain level controlled Vital Signs Assessment: post-procedure vital signs reviewed and stable Respiratory status: spontaneous breathing, nonlabored ventilation, respiratory function stable and patient connected to nasal cannula oxygen Cardiovascular status: blood pressure returned to baseline and stable Postop Assessment: no apparent nausea or vomiting Anesthetic complications: no   No notable events documented.  Last Vitals:  Vitals:   09/11/21 1405 09/11/21 1420  BP: (!) 129/93 126/90  Pulse: 69 81  Resp: (!) 24 (!) 22  Temp:  (!) 36.4 C  SpO2: 97% 97%    Last Pain:  Vitals:   09/11/21 1420  TempSrc:   PainSc: Asleep                 Yadira Hada L Venita Seng

## 2021-10-22 ENCOUNTER — Ambulatory Visit: Payer: Medicaid Other | Attending: Orthopedic Surgery

## 2021-10-22 ENCOUNTER — Other Ambulatory Visit: Payer: Self-pay

## 2021-10-22 DIAGNOSIS — Z8781 Personal history of (healed) traumatic fracture: Secondary | ICD-10-CM | POA: Insufficient documentation

## 2021-10-22 DIAGNOSIS — R262 Difficulty in walking, not elsewhere classified: Secondary | ICD-10-CM | POA: Diagnosis present

## 2021-10-22 DIAGNOSIS — M25671 Stiffness of right ankle, not elsewhere classified: Secondary | ICD-10-CM | POA: Insufficient documentation

## 2021-10-22 DIAGNOSIS — Z9889 Other specified postprocedural states: Secondary | ICD-10-CM | POA: Diagnosis present

## 2021-10-22 DIAGNOSIS — M6281 Muscle weakness (generalized): Secondary | ICD-10-CM | POA: Insufficient documentation

## 2021-10-24 NOTE — Therapy (Signed)
[facility] [address] [place], [place], [address] Phone: [phone]   Fax:  [phone]  Physical Therapy Evaluation  Patient Details  Name: Beverly Hawkins MRN: [id number] Date of Birth: 03/23/2001 Referring Provider (PT): Joen Laura, MD   Encounter Date: 10/22/2021   PT End of Session - 10/24/21 1446     Visit Number 1    Number of Visits 13    Date for PT Re-Evaluation 12/12/21    Authorization Type [place] [employer]ICA[employer] [place]    Progress Note Due on Visit 10    PT Start Time 1105    PT Stop Time 1150    PT Time Calculation (min) 45 min    Equipment Utilized During Treatment --   crutches, walking boot   Activity Tolerance Patient tolerated treatment well    Behavior During Therapy WFL for tasks assessed/performed             Past Medical History:  Diagnosis Date   Headache(784.0)    Obesity    Reflux gastritis    Walker as ambulation aid    and crutches    Past Surgical History:  Procedure Laterality Date   MANIPULATION ANKLE Right    with sedation   ORIF ANKLE FRACTURE Right 09/11/2021   Procedure: OPEN REDUCTION INTERNAL FIXATION (ORIF) ANKLE FRACTURE;  Surgeon: Joen Laura, MD;  Location: [facility] OR;  Service: Orthopedics;  Laterality: Right;    There were no vitals filed for this visit.    Subjective Assessment - 10/24/21 1505     Subjective Pt reports she broke her R ankle on 10/1 when getting dressed and lost balance and fell. Pt denies pain and reports only low pain when irt occurs    Pertinent History Obesity    Limitations Walking;Standing    Patient Stated Goals To be be able to walk again. To be able to return to work where she goes up and down steps.    Pain Score 0-No pain   0-2/10 pain range   Pain Location Ankle    Pain Orientation Right    Pain Descriptors / Indicators Aching    Pain Type Chronic pain    Pain Onset More than a month ago    Pain Frequency  Occasional    Aggravating Factors  activity level    Pain Relieving Factors rest, elevation                [facility] PT Assessment - 10/24/21 0001       Assessment   Medical Diagnosis right ankle ORIF    Referring Provider (PT) Joen Laura, MD    Onset Date/Surgical Date 09/05/21    Prior Therapy no      Precautions   Precautions Other (comment)   ankle     Restrictions   Weight Bearing Restrictions Yes    RUE Weight Bearing --   25%   Other Position/Activity Restrictions progress weight bearing 25% per week, can DC boot once 100% w/o crutches      Balance Screen   Has the patient fallen in the past 6 months Yes    How many times? 1   loss balance with dressing   Has the patient had a decrease in activity level because of a fear of falling?  No    Is the patient reluctant to leave their home because of a fear of falling?  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     Cognition   Overall Cognitive Status Within Functional Limits for tasks assessed      Observation/Other Assessments   Focus on Therapeutic Outcomes (FOTO)  NA      Observation/Other Assessments-Edema    Edema --   +1 edema R forefoot     Sensation   Light Touch Appears Intact      ROM / Strength   AROM / PROM / Strength AROM;Strength      AROM   AROM Assessment Site Ankle    Right/Left Ankle Right;Left    Right Ankle Dorsiflexion -2    Right Ankle Plantar Flexion 35    Right Ankle Inversion 20    Right Ankle Eversion 19    Left Ankle Dorsiflexion 12    Left Ankle Plantar Flexion 50    Left Ankle Inversion 30    Left Ankle Eversion 28      Strength   Strength Assessment Site Ankle    Right/Left Ankle Right;Left    Right Ankle Dorsiflexion 3+/5    Right Ankle Plantar Flexion 3+/5    Right Ankle  Inversion 3-/5    Right Ankle Eversion 3-/5    Left Ankle Dorsiflexion 5/5    Left Ankle Plantar Flexion 5/5    Left Ankle Inversion 5/5    Left Ankle Eversion 5/5      Transfers   Transfers Sit to Stand;Stand to Sit    Sit to Stand 6: Modified independent (Device/Increase time)   crutches     Ambulation/Gait   Ambulation/Gait Yes    Ambulation/Gait Assistance 6: Modified independent (Device/Increase time)    Ambulation Distance (Feet) 60 Feet    Assistive device Crutches   ahndgrips adjusted for better lift   Gait Pattern Step-to pattern    Gait velocity slow pace    Gait Comments 3 rest breaks to walk 60 ft                        Objective measurements completed on examination: See above findings.    [facility] Adult PT Treatment/Exercise:  Therapeutic Exercise: - R ankle DF/PF x10 - R ankle circumduction x10 each direction - R ankle alphabet 1 set               PT Education - 10/24/21 1445     Education Details Eval findings, POC, HEP, RICE for pain and swelling    Person(s) Educated Patient    Methods Explanation;Demonstration;Tactile cues;Verbal cues;Handout    Comprehension Verbalized understanding;Returned demonstration;Verbal cues required;Tactile cues required              PT Short Term Goals - 10/24/21 1457       PT SHORT TERM GOAL #1   Title Pt will beInd in an initail HEP    Baseline started on eval    Status New    Target Date 11/14/21      PT SHORT TERM GOAL #2   Title Pt will be able to ascend/descend 12 steps c crutches for improved community mobility    Status New    Target Date 11/14/21               PT Long Term Goals - 10/24/21 1459  PT LONG TERM GOAL #1   Title Improve R ankle ROM to wiht 80% of the L for improve functional mobility    Baseline see flowsheets    Status New    Target Date 12/12/21      PT LONG TERM GOAL #2   Title Improve R ankle strength to 4+ for improved functional mobility     Baseline se flowsheets    Status New    Target Date 12/12/21      PT LONG TERM GOAL #3   Title Pt will be able to walk 1052ft s an AD and mild limp over the R LE for community ambulation and return to her job    Status New    Target Date 12/12/21      PT LONG TERM GOAL #4   Title Pt will be able to ascend/desend 24 steps c handrail as needed for community mobility and return to her job    Status New    Target Date 12/12/21      PT LONG TERM GOAL #5   Title Set additional functional goals for the r ankle as indicated    Status New    Target Date 12/12/21                    Plan - 10/24/21 1447     Clinical Impression Statement Pt presents following a R ankle fx requiring ORIF. Pt is currently walking with crutches using a step to pattern at a slow pace c a walking boot. Pt is using the boosting method for steps. The R ankle demonstrates decreased ROM and strength. Per MD instructions, pt was instructed and practiced walking at 255 wt bearing for the R LE. pt was initiated with AROM exs for the R ankle. Pt will benefit from skilled PT 2w6 to address deficits and improve pt's functional mobility.    Personal Factors and Comorbidities Comorbidity 1    Comorbidities obesity    Stability/Clinical Decision Making Stable/Uncomplicated    Clinical Decision Making Low    Rehab Potential Good    PT Frequency 2x / week    PT Duration 6 weeks    PT Treatment/Interventions ADLs/Self Care Home Management;Aquatic Therapy;Cryotherapy;Electrical Stimulation;Ultrasound;Moist Heat;Iontophoresis 4mg /ml Dexamethasone;Gait training;Stair training;Functional mobility training;Therapeutic activities;Therapeutic exercise;Balance training;Neuromuscular re-education;Manual techniques;Patient/family education;Passive range of motion;Dry needling;Taping;Joint Manipulations    PT Next Visit Plan Assess resonse to HEP, assess hip and knee strength,    PT Home Exercise Plan [id number]    Consulted and  Agree with Plan of Care Patient             Patient will benefit from skilled therapeutic intervention in order to improve the following deficits and impairments:  Abnormal gait, Difficulty walking, Decreased range of motion, Decreased activity tolerance, Obesity, Pain, Increased edema, Decreased strength, Decreased balance  Visit Diagnosis: Decreased range of motion of right ankle  Muscle weakness (generalized)  Status post open reduction with internal fixation (ORIF) of fracture of ankle  Difficulty in walking, not elsewhere classified     Problem List Patient Active Problem List   Diagnosis Date Noted   Migraine without aura and without status migrainosus, not intractable 05/01/2013   Morbid obesity (HCC) 05/01/2013   05/03/2013 MS, PT 10/24/21 3:06 PM   [facility] [address] [place], [place], [place] Phone: [phone]   Fax:  [phone]  Name: TREY BEBEE MRN: [name] Date of Birth: 12-14-2000  Check all possible CPT codes:  92446- Therapeutic Exercise, [id number]- Neuro Re-education, [phone] - Gait Training, 17711 - Manual Therapy, [id number] - Therapeutic Activities, [id number] - Self Care, 97014 - Electrical stimulation (unattended), [id number] - Electrical stimulation (Manual), [id number] - Iontophoresis, [id number] - Ultrasound, and 38333 - Vaso

## 2021-11-03 ENCOUNTER — Encounter: Payer: Self-pay | Admitting: Physical Therapy

## 2021-11-03 ENCOUNTER — Other Ambulatory Visit: Payer: Self-pay

## 2021-11-03 ENCOUNTER — Ambulatory Visit: Payer: Medicaid Other | Admitting: Physical Therapy

## 2021-11-03 DIAGNOSIS — Z9889 Other specified postprocedural states: Secondary | ICD-10-CM

## 2021-11-03 DIAGNOSIS — M25671 Stiffness of right ankle, not elsewhere classified: Secondary | ICD-10-CM

## 2021-11-03 DIAGNOSIS — M6281 Muscle weakness (generalized): Secondary | ICD-10-CM

## 2021-11-03 DIAGNOSIS — R262 Difficulty in walking, not elsewhere classified: Secondary | ICD-10-CM

## 2021-11-03 NOTE — Therapy (Signed)
[place], [place], [address] Phone: [phone]   Fax:  [phone]  Physical Therapy Treatment  Patient Details  Name: Beverly Hawkins MRN: [id number] Date of Birth: April 04, 2001 Referring Provider (PT): Willaim Sheng, MD   Encounter Date: 11/03/2021   PT End of Session - 11/03/21 1446     Visit Number 2    Number of Visits 13    Date for PT Re-Evaluation 12/12/21    Authorization Type [place] MEDICAID AMERIHEALTH CARITAS OF     Progress Note Due on Visit 10    PT Start Time 0243    PT Stop Time 0325    PT Time Calculation (min) 42 min             Past Medical History:  Diagnosis Date   Headache(784.0)    Obesity    Reflux gastritis    Walker as ambulation aid    and crutches    Past Surgical History:  Procedure Laterality Date   MANIPULATION ANKLE Right    with sedation   ORIF ANKLE FRACTURE Right 09/11/2021   Procedure: OPEN REDUCTION INTERNAL FIXATION (ORIF) ANKLE FRACTURE;  Surgeon: Willaim Sheng, MD;  Location: [facility];  Service: Orthopedics;  Laterality: Right;    There were no vitals filed for this visit.   Subjective Assessment - 11/03/21 1445     Subjective I am ready to walk.    Currently in Pain? No/denies            [facility] Adult PT Treatment/Exercise:  Therapeutic Exercise: - ankle circles and pumps -seated toe scrunches. - Seated heel raises x 10 -seated toe raises x 10 -seated towel slides Inversion and eversion x 10  x 3 each way  - seated DF stretch with strap -supine SLR x 10 x 2 Side lying hip abduction x 10 x 2    Ambulation/Gait   Ambulation/Gait Yes    Ambulation/Gait Assistance 6: Modified independent (Device/Increase time)    Ambulation Distance (Feet) 60 Feet , 40 feet   Assistive device Crutches   ahndgrips adjusted for better lift   Gait Pattern Step-to pattern    Gait velocity moderate pace    Gait Comments 0 rest breaks to walk 60  ft         PT Education - 11/03/21 1536     Education Details HEP    Person(s) Educated Patient    Methods Explanation;Handout    Comprehension Verbalized understanding              PT Short Term Goals - 11/03/21 1454       PT SHORT TERM GOAL #1   Title Pt will beInd in an initail HEP    Baseline started on eval    Period Weeks    Status On-going      PT SHORT TERM GOAL #2   Title Pt will be able to ascend/descend 12 steps c crutches for improved community mobility    Baseline 11/03/21- can navigate with railing and one crutch - 4 steps    Status Partially Met               PT Long Term Goals - 10/24/21 1459       PT LONG TERM GOAL #1   Title Improve R ankle ROM to wiht 80% of the L for improve functional mobility    Baseline see flowsheets    Status New    Target Date  12/12/21      PT LONG TERM GOAL #2   Title Improve R ankle strength to 4+ for improved functional mobility    Baseline se flowsheets    Status New    Target Date 12/12/21      PT LONG TERM GOAL #3   Title Pt will be able to walk 1054f s an AD and mild limp over the R LE for community ambulation and return to her job    Status New    Target Date 12/12/21      PT LONG TERM GOAL #4   Title Pt will be able to ascend/desend 24 steps c handrail as needed for community mobility and return to her job    Status New    Target Date 12/12/21      PT LONG TERM GOAL #5   Title Set additional functional goals for the r ankle as indicated    Status New    Target Date 12/12/21                   Plan - 11/03/21 1509     Clinical Impression Statement Pt arrives with Bilateral crutches and CAm boot utilizing step to pattern. Able to progress WB to 50% in cam boot with bilateral crutches and step through pattern with intermittent discomfort. reviewed HEP and progressed with hip strength, ankle ROM and ankle AAROM. She was given an updated HEP.    PT Treatment/Interventions ADLs/Self Care  Home Management;Aquatic Therapy;Cryotherapy;Electrical Stimulation;Ultrasound;Moist Heat;Iontophoresis 485mml Dexamethasone;Gait training;Stair training;Functional mobility training;Therapeutic activities;Therapeutic exercise;Balance training;Neuromuscular re-education;Manual techniques;Patient/family education;Passive range of motion;Dry needling;Taping;Joint Manipulations    PT Next Visit Plan Assess resonse to HEP, assess hip and knee strength,    PT Home Exercise Plan [email or web address]             Patient will benefit from skilled therapeutic intervention in order to improve the following deficits and impairments:  Abnormal gait, Difficulty walking, Decreased range of motion, Decreased activity tolerance, Obesity, Pain, Increased edema, Decreased strength, Decreased balance  Visit Diagnosis: Decreased range of motion of right ankle  Muscle weakness (generalized)  Status post open reduction with internal fixation (ORIF) of fracture of ankle  Difficulty in walking, not elsewhere classified     Problem List Patient Active Problem List   Diagnosis Date Noted   Migraine without aura and without status migrainosus, not intractable 05/01/2013   Morbid obesity (HCOmaha05/27/2014    DoDorene ArPTA 11/03/2021, 3:36 PM  [facility]hone: [phone] Fax:  [phone]: Beverly ZAVALETARN: [id number] of Birth: [birth year]

## 2021-11-03 NOTE — Patient Instructions (Signed)
Access Code: [email or web address] URL: [email or web address] Date: 11/03/2021 Prepared by: Jannette Spanner  Exercises Supine Ankle Pumps - 3 x daily - 7 x weekly - 1 sets - 10 reps Supine Ankle Circles - 3 x daily - 7 x weekly - 1 sets - 10 reps Seated Ankle Alphabet - 3 x daily - 7 x weekly - 1 sets - 1 reps Ankle Inversion Eversion Towel Slide - 3 x daily - 7 x weekly - 2 sets - 10 reps Towel Scrunches - 3 x daily - 7 x weekly - 2 sets - 10 reps Seated Calf Stretch with Strap - 3 x daily - 7 x weekly - 2 sets - 10 reps Sidelying Hip Abduction - 1 x daily - 7 x weekly - 2 sets - 10 reps

## 2021-11-05 ENCOUNTER — Encounter: Payer: Self-pay | Admitting: Physical Therapy

## 2021-11-05 ENCOUNTER — Other Ambulatory Visit: Payer: Self-pay

## 2021-11-05 ENCOUNTER — Ambulatory Visit: Payer: Medicaid Other | Attending: Orthopedic Surgery | Admitting: Physical Therapy

## 2021-11-05 DIAGNOSIS — M25671 Stiffness of right ankle, not elsewhere classified: Secondary | ICD-10-CM

## 2021-11-05 DIAGNOSIS — R262 Difficulty in walking, not elsewhere classified: Secondary | ICD-10-CM

## 2021-11-05 DIAGNOSIS — Z9889 Other specified postprocedural states: Secondary | ICD-10-CM | POA: Diagnosis present

## 2021-11-05 DIAGNOSIS — Z8781 Personal history of (healed) traumatic fracture: Secondary | ICD-10-CM | POA: Diagnosis present

## 2021-11-05 DIAGNOSIS — M6281 Muscle weakness (generalized): Secondary | ICD-10-CM | POA: Diagnosis present

## 2021-11-05 NOTE — Therapy (Signed)
[place], [place], [address] Phone: [phone]   Fax:  [phone]  Physical Therapy Treatment  Patient Details  Name: Beverly Hawkins MRN: [id number] Date of Birth: [birth year] Referring Provider (PT): Willaim Sheng, MD   Encounter Date: 11/05/2021   PT End of Session - 11/05/21 1149     Visit Number 3    Number of Visits 13    Date for PT Re-Evaluation 12/12/21    Authorization Type [place] MEDICAID AMERIHEALTH CARITAS OF [place]- no auth for first 12 visits    PT Start Time 8786    PT Stop Time 1230    PT Time Calculation (min) 45 min             Past Medical History:  Diagnosis Date   Headache(784.0)    Obesity    Reflux gastritis    Walker as ambulation aid    and crutches    Past Surgical History:  Procedure Laterality Date   MANIPULATION ANKLE Right    with sedation   ORIF ANKLE FRACTURE Right 09/11/2021   Procedure: OPEN REDUCTION INTERNAL FIXATION (ORIF) ANKLE FRACTURE;  Surgeon: Willaim Sheng, MD;  Location: [facility];  Service: Orthopedics;  Laterality: Right;    There were no vitals filed for this visit.   Subjective Assessment - 11/05/21 1148     Subjective No pain on arrival. Pt reports compliance with HEP.    Currently in Pain? No/denies             [facility] Adult PT Treatment/Exercise:  Gait: in clinic with bilat crutches and cam boot, improved step through pattern x 75 feet.  Therapeutic Exercise: - ankle circles and pumps -seated toe scrunches, toe yoga -seated Baps L2 A/P , inv/EV , then circles , all 10 each  - Seated heel raises x 10- cues for eccentric control  -seated toe raises x 10- cues for eccentric control  -seated towel slides Inversion and eversion x 10  x 3 each way  - supine DF stretch with strap-cues to avoid excessive supination -supine SLR x 10 x 2 Side lying hip abduction x 10 x 2  -prone hip extension 10 x 2 each -yellow band seated 4 way  ankle x 10 each-   -passive ankle stretching - all planes      PT Short Term Goals - 11/03/21 1454       PT SHORT TERM GOAL #1   Title Pt will beInd in an initail HEP    Baseline started on eval    Period Weeks    Status On-going      PT SHORT TERM GOAL #2   Title Pt will be able to ascend/descend 12 steps c crutches for improved community mobility    Baseline 11/03/21- can navigate with railing and one crutch - 4 steps    Status Partially Met               PT Long Term Goals - 10/24/21 1459       PT LONG TERM GOAL #1   Title Improve R ankle ROM to wiht 80% of the L for improve functional mobility    Baseline see flowsheets    Status New    Target Date 12/12/21      PT LONG TERM GOAL #2   Title Improve R ankle strength to 4+ for improved functional mobility    Baseline se flowsheets    Status New  Target Date 12/12/21      PT LONG TERM GOAL #3   Title Pt will be able to walk 1015f s an AD and mild limp over the R LE for community ambulation and return to her job    Status New    Target Date 12/12/21      PT LONG TERM GOAL #4   Title Pt will be able to ascend/desend 24 steps c handrail as needed for community mobility and return to her job    Status New    Target Date 12/12/21      PT LONG TERM GOAL #5   Title Set additional functional goals for the r ankle as indicated    Status New    Target Date 12/12/21                   Plan - 11/05/21 1149     Clinical Impression Statement Pt arrives with bilateral crutches and improved step through pattern with 50% WB in cam boot. Continued with gait in clinic and began Nustep without boot or shoe. Able to tolerate L3 for 3 minutes prior to increasing pain. Began seated BAPS board and yellow band resistance for 4 way ankle strengthening.  Continued with previously prescribed exercises with good tolerance and reviewed newest HEP. Progressed hip strength with prone hip extensions. She was given an updated  HEP. At end of session she reported feeling a little sore.    PT Treatment/Interventions ADLs/Self Care Home Management;Aquatic Therapy;Cryotherapy;Electrical Stimulation;Ultrasound;Moist Heat;Iontophoresis 442mml Dexamethasone;Gait training;Stair training;Functional mobility training;Therapeutic activities;Therapeutic exercise;Balance training;Neuromuscular re-education;Manual techniques;Patient/family education;Passive range of motion;Dry needling;Taping;Joint Manipulations    PT Next Visit Plan Assess resonse to HEP, progress per WB status (currently 50% , can progress to 75% next week)    PT Home Exercise Plan [email or web address]    Consulted and Agree with Plan of Care Patient             Patient will benefit from skilled therapeutic intervention in order to improve the following deficits and impairments:  Abnormal gait, Difficulty walking, Decreased range of motion, Decreased activity tolerance, Obesity, Pain, Increased edema, Decreased strength, Decreased balance  Visit Diagnosis: Decreased range of motion of right ankle  Muscle weakness (generalized)  Status post open reduction with internal fixation (ORIF) of fracture of ankle  Difficulty in walking, not elsewhere classified     Problem List Patient Active Problem List   Diagnosis Date Noted   Migraine without aura and without status migrainosus, not intractable 05/01/2013   Morbid obesity (HCUbly05/27/2014    DoDorene ArPTA 11/05/2021, 12:47 PM  [facility]rPort [place]hone: [phone] Fax:  [phone]: Beverly BENEDICTRN: [id number] of Birth: [birth year]

## 2021-11-10 ENCOUNTER — Other Ambulatory Visit: Payer: Self-pay

## 2021-11-10 ENCOUNTER — Encounter: Payer: Self-pay | Admitting: Physical Therapy

## 2021-11-10 ENCOUNTER — Ambulatory Visit: Payer: Medicaid Other | Admitting: Physical Therapy

## 2021-11-10 DIAGNOSIS — R262 Difficulty in walking, not elsewhere classified: Secondary | ICD-10-CM

## 2021-11-10 DIAGNOSIS — M25671 Stiffness of right ankle, not elsewhere classified: Secondary | ICD-10-CM

## 2021-11-10 DIAGNOSIS — Z9889 Other specified postprocedural states: Secondary | ICD-10-CM

## 2021-11-10 DIAGNOSIS — M6281 Muscle weakness (generalized): Secondary | ICD-10-CM

## 2021-11-10 NOTE — Therapy (Signed)
[place], [place], [address] Phone: [phone]   Fax:  [phone]  Physical Therapy Treatment  Patient Details  Name: Beverly Hawkins MRN: [id number] Date of Birth: 2001-05-30 Referring Provider (PT): Willaim Sheng, MD   Encounter Date: 11/10/2021   PT End of Session - 11/10/21 1240     Visit Number 4    Number of Visits 13    Date for PT Re-Evaluation 12/12/21    Authorization Type [place] MEDICAID AMERIHEALTH CARITAS OF [place]- no auth for first 12 visits    Progress Note Due on Visit 10    PT Start Time 1230    PT Stop Time 1311    PT Time Calculation (min) 41 min             Past Medical History:  Diagnosis Date   Headache(784.0)    Obesity    Reflux gastritis    Walker as ambulation aid    and crutches    Past Surgical History:  Procedure Laterality Date   MANIPULATION ANKLE Right    with sedation   ORIF ANKLE FRACTURE Right 09/11/2021   Procedure: OPEN REDUCTION INTERNAL FIXATION (ORIF) ANKLE FRACTURE;  Surgeon: Willaim Sheng, MD;  Location: [facility];  Service: Orthopedics;  Laterality: Right;    There were no vitals filed for this visit.   Subjective Assessment - 11/10/21 1234     Subjective Only have pain sometimes with stretches at medial ankle. Otherwise no pain walking. Some pain with stairs.    Currently in Pain? No/denies                [facility] PT Assessment - 11/10/21 0001       AROM   Right Ankle Dorsiflexion 2                           [facility] Adult PT Treatment/Exercise - 11/10/21 0001       Ambulation/Gait   Ambulation/Gait Assistance 6: Modified independent (Device/Increase time)    Ambulation Distance (Feet) 180 Feet    Gait Pattern Step-through pattern    Gait velocity moderate pace    Stairs Yes    Stairs Assistance 6: Modified independent (Device/Increase time)    Stair Management Technique Step to pattern    Height of Stairs  6    Gait Comments able to negotiate 4 stairs with 1 crutch and 1 hand rail with step to pattern.             [facility] Adult PT Treatment/Exercise:   Therapeutic Exercise:  -seated toe scrunches, toe yoga -seated Baps L2 A/P , inv/EV , then circles , all 10 each  -Seated prostretch -AAROM  10 sec x 10 reps -seated towel slides Inversion and eversion x 10  x 3 each way  - seated gastroc and soleus stretch with strap- -supine SLR x 10 x 2 Side lying hip abduction x 10 x 2  -prone hip extension 10 x 2 each -red band seated 4 way ankle x 10 each-     Interventions NOT performed today-  - ankle circles and pumps - Seated heel raises x 10- cues for eccentric control  -seated toe raises x 10- cues for eccentric control  -passive ankle stretching - all planes      PT Short Term Goals - 11/03/21 1454       PT SHORT TERM GOAL #1   Title Pt  will beInd in an initail HEP    Baseline started on eval    Period Weeks    Status On-going      PT SHORT TERM GOAL #2   Title Pt will be able to ascend/descend 12 steps c crutches for improved community mobility    Baseline 11/03/21- can navigate with railing and one crutch - 4 steps    Status Partially Met               PT Long Term Goals - 10/24/21 1459       PT LONG TERM GOAL #1   Title Improve R ankle ROM to wiht 80% of the L for improve functional mobility    Baseline see flowsheets    Status New    Target Date 12/12/21      PT LONG TERM GOAL #2   Title Improve R ankle strength to 4+ for improved functional mobility    Baseline se flowsheets    Status New    Target Date 12/12/21      PT LONG TERM GOAL #3   Title Pt will be able to walk 1043f s an AD and mild limp over the R LE for community ambulation and return to her job    Status New    Target Date 12/12/21      PT LONG TERM GOAL #4   Title Pt will be able to ascend/desend 24 steps c handrail as needed for community mobility and return to her job    Status  New    Target Date 12/12/21      PT LONG TERM GOAL #5   Title Set additional functional goals for the r ankle as indicated    Status New    Target Date 12/12/21                   Plan - 11/10/21 1306     Clinical Impression Statement Pt is making great progress with weightbearing progression. Able to transition to 1 crutch in cam boot. Per referral can DC boot once 100% without crutches. Reviewed gait on stair and able to complete safely with 1 crutch and 1 handrail. DF ROM is improving.    PT Treatment/Interventions ADLs/Self Care Home Management;Aquatic Therapy;Cryotherapy;Electrical Stimulation;Ultrasound;Moist Heat;Iontophoresis 456mml Dexamethasone;Gait training;Stair training;Functional mobility training;Therapeutic activities;Therapeutic exercise;Balance training;Neuromuscular re-education;Manual techniques;Patient/family education;Passive range of motion;Dry needling;Taping;Joint Manipulations    PT Next Visit Plan Assess resonse to HEP, progress per WB status (currently 50% , can progress to 75% next week)- pt to bring shoe next visit to begin weight shifting progression    PT Home Exercise Plan [email or web address]             Patient will benefit from skilled therapeutic intervention in order to improve the following deficits and impairments:  Abnormal gait, Difficulty walking, Decreased range of motion, Decreased activity tolerance, Obesity, Pain, Increased edema, Decreased strength, Decreased balance  Visit Diagnosis: Decreased range of motion of right ankle  Muscle weakness (generalized)  Difficulty in walking, not elsewhere classified  Status post open reduction with internal fixation (ORIF) of fracture of ankle     Problem List Patient Active Problem List   Diagnosis Date Noted   Migraine without aura and without status migrainosus, not intractable 05/01/2013   Morbid obesity (HCFirst Mesa05/27/2014    DoDorene ArPTA 11/10/2021, 1:17 PM  [facility]rT[place]hone: [phone] Fax:  [phone]:  JAMILYN PIGEON MRN: [id number] Date of Birth: 07/18/2001

## 2021-11-12 ENCOUNTER — Ambulatory Visit: Payer: Medicaid Other | Admitting: Physical Therapy

## 2021-11-12 ENCOUNTER — Encounter: Payer: Self-pay | Admitting: Physical Therapy

## 2021-11-12 ENCOUNTER — Other Ambulatory Visit: Payer: Self-pay

## 2021-11-12 DIAGNOSIS — M25671 Stiffness of right ankle, not elsewhere classified: Secondary | ICD-10-CM | POA: Diagnosis not present

## 2021-11-12 DIAGNOSIS — M6281 Muscle weakness (generalized): Secondary | ICD-10-CM

## 2021-11-12 DIAGNOSIS — R262 Difficulty in walking, not elsewhere classified: Secondary | ICD-10-CM

## 2021-11-12 DIAGNOSIS — Z8781 Personal history of (healed) traumatic fracture: Secondary | ICD-10-CM

## 2021-11-12 NOTE — Therapy (Signed)
[place], [place], [address] Phone: [phone]   Fax:  [phone]  Physical Therapy Treatment  Patient Details  Name: Beverly Hawkins MRN: [id number] Date of Birth: 2001/06/01 Referring Provider (PT): Willaim Sheng, MD   Encounter Date: 11/12/2021   PT End of Session - 11/12/21 1108     Visit Number 5    Number of Visits 13    Date for PT Re-Evaluation 12/12/21    Authorization Type [place] MEDICAID AMERIHEALTH CARITAS OF - no auth for first 12 visits    Progress Note Due on Visit 10    PT Start Time 1100    PT Stop Time 1145    PT Time Calculation (min) 45 min             Past Medical History:  Diagnosis Date   Headache(784.0)    Obesity    Reflux gastritis    Walker as ambulation aid    and crutches    Past Surgical History:  Procedure Laterality Date   MANIPULATION ANKLE Right    with sedation   ORIF ANKLE FRACTURE Right 09/11/2021   Procedure: OPEN REDUCTION INTERNAL FIXATION (ORIF) ANKLE FRACTURE;  Surgeon: Willaim Sheng, MD;  Location: [facility];  Service: Orthopedics;  Laterality: Right;    There were no vitals filed for this visit.   Subjective Assessment - 11/12/21 1105     Subjective I am doing well with one crutch and CAM boot. Spoke to MD on phone and released back to work , light duty has [employer].    Currently in Pain? No/denies    Pain Score 0-No pain    Pain Location Ankle             [facility] Adult PT Treatment/Exercise:   Therapeutic Exercise: -standing In bilateral shoes: 10 se lateral weight shifts x 10 -sit-stand x 10  -seated toe scrunches, toe yoga - seated gastroc and soleus stretch with strap- -supine SLR x 10 x 2 2# Side lying hip abduction x 10 x 2  2# -prone hip extension 10 x 2 each 2# -red band supine 4 way ankle x 10 each- green for PF    Modalities: Vaso- low compression , 2.5 snowflakes , x 10 minutes   Interventions NOT  performed today-  - ankle circles and pumps - Seated heel raises x 10- cues for eccentric control  -seated toe raises x 10- cues for eccentric control  -passive ankle stretching - all planes --seated towel slides Inversion and eversion x 10  x 3 each way  --Seated prostretch -AAROM  10 sec x 10 reps --seated Baps L2 A/P , inv/EV , then circles , all 10 each        PT Short Term Goals - 11/03/21 1454       PT SHORT TERM GOAL #1   Title Pt will beInd in an initail HEP    Baseline started on eval    Period Weeks    Status On-going      PT SHORT TERM GOAL #2   Title Pt will be able to ascend/descend 12 steps c crutches for improved community mobility    Baseline 11/03/21- can navigate with railing and one crutch - 4 steps    Status Partially Met               PT Long Term Goals - 10/24/21 1459       PT LONG TERM GOAL #  1   Title Improve R ankle ROM to wiht 80% of the L for improve functional mobility    Baseline see flowsheets    Status New    Target Date 12/12/21      PT LONG TERM GOAL #2   Title Improve R ankle strength to 4+ for improved functional mobility    Baseline se flowsheets    Status New    Target Date 12/12/21      PT LONG TERM GOAL #3   Title Pt will be able to walk 106f s an AD and mild limp over the R LE for community ambulation and return to her job    Status New    Target Date 12/12/21      PT LONG TERM GOAL #4   Title Pt will be able to ascend/desend 24 steps c handrail as needed for community mobility and return to her job    Status New    Target Date 12/12/21      PT LONG TERM GOAL #5   Title Set additional functional goals for the r ankle as indicated    Status New    Target Date 12/12/21                   Plan - 11/12/21 1141     Clinical Impression Statement Pt reports transition to 1 crutch is going well. She spoke to her MD this morning who agreed for her to RTW light duty in current status of 75% WB in boot and with  1 crutch. Today , she brought her other shoe so able to begin static stand in shoes with lateral weight shifting. SHe reported feeling pressure and that is was unusual because she has not put any pressure on her RLE without the boot until now. Continued with seated therex and added resistance to mat based hip strength. She will continue with 1 crutch in boot to normalize gait pattern and will walk without crutches in home. Will hopefuilly, be able to transition out of boot next week.    PT Treatment/Interventions ADLs/Self Care Home Management;Aquatic Therapy;Cryotherapy;Electrical Stimulation;Ultrasound;Moist Heat;Iontophoresis 457mml Dexamethasone;Gait training;Stair training;Functional mobility training;Therapeutic activities;Therapeutic exercise;Balance training;Neuromuscular re-education;Manual techniques;Patient/family education;Passive range of motion;Dry needling;Taping;Joint Manipulations    PT Next Visit Plan Assess resonse to HEP, progress per WB status (currently 50% , can progress to 75% next week)- pt to bring shoe next visit to begin weight shifting progression    PT Home Exercise Plan [email or web address]             Patient will benefit from skilled therapeutic intervention in order to improve the following deficits and impairments:  Abnormal gait, Difficulty walking, Decreased range of motion, Decreased activity tolerance, Obesity, Pain, Increased edema, Decreased strength, Decreased balance  Visit Diagnosis: Decreased range of motion of right ankle  Muscle weakness (generalized)  Difficulty in walking, not elsewhere classified  Status post open reduction with internal fixation (ORIF) of fracture of ankle     Problem List Patient Active Problem List   Diagnosis Date Noted   Migraine without aura and without status migrainosus, not intractable 05/01/2013   Morbid obesity (HCOak Grove05/27/2014    DoDorene ArPTA 11/12/2021, 11:57 AM  [facility]rPoquonock BridgeNC[place]hone: [phone] Fax:  [phone]: NeROSLYN ELSERN: [id number] of Birth: 03/2001/08/09

## 2021-11-17 ENCOUNTER — Ambulatory Visit: Payer: Medicaid Other | Admitting: Physical Therapy

## 2021-11-17 ENCOUNTER — Other Ambulatory Visit: Payer: Self-pay

## 2021-11-17 ENCOUNTER — Encounter: Payer: Self-pay | Admitting: Physical Therapy

## 2021-11-17 DIAGNOSIS — Z9889 Other specified postprocedural states: Secondary | ICD-10-CM

## 2021-11-17 DIAGNOSIS — Z8781 Personal history of (healed) traumatic fracture: Secondary | ICD-10-CM

## 2021-11-17 DIAGNOSIS — M6281 Muscle weakness (generalized): Secondary | ICD-10-CM

## 2021-11-17 DIAGNOSIS — M25671 Stiffness of right ankle, not elsewhere classified: Secondary | ICD-10-CM | POA: Diagnosis not present

## 2021-11-17 DIAGNOSIS — R262 Difficulty in walking, not elsewhere classified: Secondary | ICD-10-CM

## 2021-11-17 NOTE — Therapy (Signed)
[place], [place], [address] Phone: [phone]   Fax:  [phone]  Physical Therapy Treatment  Patient Details  Name: Beverly Hawkins MRN: [id number] Date of Birth: [birth year] Referring Provider (PT): Willaim Sheng, MD   Encounter Date: 11/17/2021   PT End of Session - 11/17/21 1151     Visit Number 6    Number of Visits 13    Date for PT Re-Evaluation 12/12/21    Authorization Type [place] MEDICAID AMER[employer]HEALTH CARITAS OF [place]- no auth for first 12 visits    Progress Note Due on Visit 10    PT Start Time 3790    PT Stop Time 1240    PT Time Calculation (min) 55 min             Past Medical History:  Diagnosis Date   Headache(784.0)    Obesity    Reflux gastritis    Walker as ambulation aid    and crutches    Past Surgical History:  Procedure Laterality Date   MANIPULATION ANKLE Right    with sedation   ORIF ANKLE FRACTURE Right 09/11/2021   Procedure: OPEN REDUCTION INTERNAL FIXATION (ORIF) ANKLE FRACTURE;  Surgeon: Willaim Sheng, MD;  Location: [facility];  Service: Orthopedics;  Laterality: Right;    There were no vitals filed for this visit.   Subjective Assessment - 11/17/21 1149     Subjective Went back to work for 8 hours. My pain increased to 4.5/10 while at work and I probably over did it. It was stiff this morning but I did my exercises.    Currently in Pain? No/denies                [facility] PT Assessment - 11/17/21 0001       AROM   Right Ankle Dorsiflexion 5    Right Ankle Plantar Flexion 50               [facility] Adult PT Treatment/Exercise - 11/10/21 0001       Ambulation/Gait   Ambulation/Gait Assistance Independent    Ambulation Distance (Feet) 180 Feet    Gait Pattern Step-through pattern    Gait velocity moderate pace                                 [facility] Adult PT Treatment/Exercise:   Therapeutic Exercise:  - seated gastroc and  soleus stretch with strap- -red band seated 4 way ankle x 10 each-  -passive ankle stretching - all planes -feet on floor standing balance with alternating step forward - UE on parallel bars -standing on Airex narrow with EC 30 sec x 2   Modalities: Vasopneumatic Device low pressure coldest setting x 8 minutes    Interventions NOT performed today-  - ankle circles and pumps - Seated heel raises x 10- cues for eccentric control  -seated toe raises x 10- cues for eccentric control  -seated toe scrunches, toe yoga -seated Baps L2 A/P , inv/EV , then circles , all 10 each  -Seated prostretch -AAROM  10 sec x 10 reps -seated towel slides Inversion and eversion x 10  x 3 each way  -supine SLR x 10 x 2 Side lying hip abduction x 10 x 2  -prone hip extension 10 x 2 each         PT Short Term Goals - 11/03/21 1454  PT SHORT TERM GOAL #1   Title Pt will beInd in an initail HEP    Baseline started on eval    Period Weeks    Status On-going      PT SHORT TERM GOAL #2   Title Pt will be able to ascend/descend 12 steps c crutches for improved community mobility    Baseline 11/03/21- can navigate with railing and one crutch - 4 steps    Status Partially Met               PT Long Term Goals - 10/24/21 1459       PT LONG TERM GOAL #1   Title Improve R ankle ROM to wiht 80% of the L for improve functional mobility    Baseline see flowsheets    Status New    Target Date 12/12/21      PT LONG TERM GOAL #2   Title Improve R ankle strength to 4+ for improved functional mobility    Baseline se flowsheets    Status New    Target Date 12/12/21      PT LONG TERM GOAL #3   Title Pt will be able to walk 1019f s an AD and mild limp over the R LE for community ambulation and return to her job    Status New    Target Date 12/12/21      PT LONG TERM GOAL #4   Title Pt will be able to ascend/desend 24 steps c handrail as needed for community mobility and return to her job     Status New    Target Date 12/12/21      PT LONG TERM GOAL #5   Title Set additional functional goals for the r ankle as indicated    Status New    Target Date 12/12/21                   Plan - 11/17/21 1200     Clinical Impression Statement Pt reports increased pain with return to work which resolved after resting. She forgot her other shoe today so worked on progression to full weightbearing in boot. Began Bilat LE static standing on foam in socks only, with EC and min sway. DF and PF ROM improved. She is encouraged to continued FWB in boot without crutches except if needed for work. Per protocol wil work to wean from boot and she will likely need crutches for this transition.    PT Treatment/Interventions ADLs/Self Care Home Management;Aquatic Therapy;Cryotherapy;Electrical Stimulation;Ultrasound;Moist Heat;Iontophoresis 472mml Dexamethasone;Gait training;Stair training;Functional mobility training;Therapeutic activities;Therapeutic exercise;Balance training;Neuromuscular re-education;Manual techniques;Patient/family education;Passive range of motion;Dry needling;Taping;Joint Manipulations    PT Next Visit Plan Assess resonse to HEP, progress per WB status (currently 100% in boot)- pt to bring shoe next visit to begin CAM boot weaning progression    PT Home Exercise Plan [id number]             Patient will benefit from skilled therapeutic intervention in order to improve the following deficits and impairments:  Abnormal gait, Difficulty walking, Decreased range of motion, Decreased activity tolerance, Obesity, Pain, Increased edema, Decreased strength, Decreased balance  Visit Diagnosis: Decreased range of motion of right ankle  Muscle weakness (generalized)  Difficulty in walking, not elsewhere classified  Status post open reduction with internal fixation (ORIF) of fracture of ankle     Problem List Patient Active Problem List   Diagnosis Date Noted   Migraine  without aura and without status migrainosus, not intractable 05/01/2013  Morbid obesity ([place]) 05/01/2013    Dorene Ar, PTA 11/17/2021, 12:29 PM  [facility] [address] [place], [place], [address] Phone: [phone]   Fax:  [phone]  Name: Beverly Hawkins MRN: [id number] Date of Birth: [birth year]

## 2021-11-19 ENCOUNTER — Encounter: Payer: Self-pay | Admitting: Physical Therapy

## 2021-11-19 ENCOUNTER — Other Ambulatory Visit: Payer: Self-pay

## 2021-11-19 ENCOUNTER — Ambulatory Visit: Payer: Medicaid Other | Admitting: Physical Therapy

## 2021-11-19 DIAGNOSIS — R262 Difficulty in walking, not elsewhere classified: Secondary | ICD-10-CM

## 2021-11-19 DIAGNOSIS — Z8781 Personal history of (healed) traumatic fracture: Secondary | ICD-10-CM

## 2021-11-19 DIAGNOSIS — M25671 Stiffness of right ankle, not elsewhere classified: Secondary | ICD-10-CM | POA: Diagnosis not present

## 2021-11-19 DIAGNOSIS — M6281 Muscle weakness (generalized): Secondary | ICD-10-CM

## 2021-11-19 NOTE — Therapy (Signed)
[place], [place], [address] Phone: [phone]   Fax:  [phone]  Physical Therapy Treatment  Patient Details  Name: Beverly Hawkins MRN: [id number] Date of Birth: [birth year] Referring Provider (PT): Willaim Sheng, MD   Encounter Date: 11/19/2021   PT End of Session - 11/19/21 1152     Visit Number 7    Number of Visits 13    Date for PT Re-Evaluation 12/12/21    Authorization Type [place] MEDICAID AMER[employer]HEALTH CARITAS OF [place]- no auth for first 12 visits    Progress Note Due on Visit 10    PT Start Time 1147    PT Stop Time 1237    PT Time Calculation (min) 50 min             Past Medical History:  Diagnosis Date   Headache(784.0)    Obesity    Reflux gastritis    Walker as ambulation aid    and crutches    Past Surgical History:  Procedure Laterality Date   MANIPULATION ANKLE Right    with sedation   ORIF ANKLE FRACTURE Right 09/11/2021   Procedure: OPEN REDUCTION INTERNAL FIXATION (ORIF) ANKLE FRACTURE;  Surgeon: Willaim Sheng, MD;  Location: [facility];  Service: Orthopedics;  Laterality: Right;    There were no vitals filed for this visit.   Subjective Assessment - 11/19/21 1149     Subjective i did not over do it at work yesterday. I did walk on my foot without the boot accidentally yesterday. It felt different- not pain,but pressure.    Currently in Pain? No/denies             [facility] Adult PT Treatment/Exercise:  Gait: without Cam boot and with single crutch 120 ft with step through pattern  Therapeutic Exercise: - Nustep L 5 LE only x 5 minutes  -seated heel raise with 5# resting on knee x 20  - seated toe raises x 10  - seated gastroc and soleus stretch with strap- -seated towel slides Inversion and eversion x 10  x 1 each way  2# on towels - seated isometric ball squeeze for ankle inversion 5 sec x 10  ---passive ankle stretching - all planes -standing on  Airex narrow with EC 30 sec x 2  -standing on airex with alternating march  - tandem on floor 20 sec each foot    Modalities: ice pack to right ankle x 10 min   Interventions NOT performed today-   -seated toe raises x 10- cues for eccentric control  -seated toe scrunches, toe yoga -seated Baps L2 A/P , inv/EV , then circles , all 10 each  -Seated prostretch -AAROM  10 sec x 10 reps  -supine SLR x 10 x 2 Side lying hip abduction x 10 x 2  -prone hip extension 10 x 2 each -feet on floor standing balance with alternating step forward - UE on parallel bars- --red band seated 4 way ankle x 10 each-           PT Short Term Goals - 11/03/21 1454       PT SHORT TERM GOAL #1   Title Pt will beInd in an initail HEP    Baseline started on eval    Period Weeks    Status On-going      PT SHORT TERM GOAL #2   Title Pt will be able to ascend/descend 12 steps c crutches for improved community mobility  Baseline 11/03/21- can navigate with railing and one crutch - 4 steps    Status Partially Met               PT Long Term Goals - 10/24/21 1459       PT LONG TERM GOAL #1   Title Improve R ankle ROM to wiht 80% of the L for improve functional mobility    Baseline see flowsheets    Status New    Target Date 12/12/21      PT LONG TERM GOAL #2   Title Improve R ankle strength to 4+ for improved functional mobility    Baseline se flowsheets    Status New    Target Date 12/12/21      PT LONG TERM GOAL #3   Title Pt will be able to walk 1072f s an AD and mild limp over the R LE for community ambulation and return to her job    Status New    Target Date 12/12/21      PT LONG TERM GOAL #4   Title Pt will be able to ascend/desend 24 steps c handrail as needed for community mobility and return to her job    Status New    Target Date 12/12/21      PT LONG TERM GOAL #5   Title Set additional functional goals for the r ankle as indicated    Status New    Target Date  12/12/21                   Plan - 11/19/21 1228     Clinical Impression Statement Pt arrives with cam boot and carrying single crutch due to rainy weather outside. She has been working on full weight bearing in boot without crutches and has done well with this. Today. 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 Able to ambulate with step through pattern and min antalgic pattern. Continued with weight bearing exercise incluing balance and weightshifting with some min increased pain in medial and lateral ankle. She will begin amulating in home without cam boot and single crutch however will still wear boot to work for now.    PT Treatment/Interventions ADLs/Self Care Home Management;Aquatic Therapy;Cryotherapy;Electrical Stimulation;Ultrasound;Moist Heat;Iontophoresis 423mml Dexamethasone;Gait training;Stair training;Functional mobility training;Therapeutic activities;Therapeutic exercise;Balance training;Neuromuscular re-education;Manual techniques;Patient/family education;Passive range of motion;Dry needling;Taping;Joint Manipulations    PT Next Visit Plan Assess resonse to HEP, progress per WB status (currently 100% in boot)- pt to bring shoe next visit to begin CAM boot weaning progression    PT Home Exercise Plan [id number]    Consulted and Agree with Plan of Care Patient             Patient will benefit from skilled therapeutic intervention in order to improve the following deficits and impairments:  Abnormal gait, Difficulty walking, Decreased range of motion, Decreased activity tolerance, Obesity, Pain, Increased edema, Decreased strength, Decreased balance  Visit Diagnosis: Decreased range of motion of right ankle  Muscle weakness (generalized)  Difficulty in walking, not elsewhere classified  Status post open reduction with internal fixation (ORIF) of fracture of ankle     Problem List Patient Active Problem List   Diagnosis Date Noted   Migraine  without aura and without status migrainosus, not intractable 05/01/2013   Morbid obesity ([place]05/27/2014    DoDorene ArPTA 11/19/2021, 12:33 PM  [facility]one: [phone] Fax:  [phone]  Name: CHERLYN SYRING MRN: [id number] Date of Birth: Apr 16, 2001

## 2021-11-24 ENCOUNTER — Ambulatory Visit: Payer: Medicaid Other | Admitting: Physical Therapy

## 2021-11-25 ENCOUNTER — Encounter: Payer: Self-pay | Admitting: Physical Therapy

## 2021-11-25 ENCOUNTER — Other Ambulatory Visit: Payer: Self-pay

## 2021-11-25 ENCOUNTER — Ambulatory Visit: Payer: Medicaid Other | Admitting: Physical Therapy

## 2021-11-25 DIAGNOSIS — R262 Difficulty in walking, not elsewhere classified: Secondary | ICD-10-CM

## 2021-11-25 DIAGNOSIS — Z9889 Other specified postprocedural states: Secondary | ICD-10-CM

## 2021-11-25 DIAGNOSIS — M25671 Stiffness of right ankle, not elsewhere classified: Secondary | ICD-10-CM

## 2021-11-25 DIAGNOSIS — M6281 Muscle weakness (generalized): Secondary | ICD-10-CM

## 2021-11-25 NOTE — Therapy (Signed)
[place], [place], [address] Phone: [phone]   Fax:  [phone]  Physical Therapy Treatment  Patient Details  Name: RUBIE FICCO MRN: [id number] Date of Birth: 2001-10-22 Referring Provider (PT): Willaim Sheng, MD   Encounter Date: 11/25/2021   PT End of Session - 11/25/21 0722     Visit Number 8    Number of Visits 13    Date for PT Re-Evaluation 12/12/21    Authorization Type [place] MEDICAID AMERIHEALTH CARITAS OF [place]- no auth for first 12 visits    PT Start Time 0715    PT Stop Time 0755    PT Time Calculation (min) 40 min             Past Medical History:  Diagnosis Date   Headache(784.0)    Obesity    Reflux gastritis    Walker as ambulation aid    and crutches    Past Surgical History:  Procedure Laterality Date   MANIPULATION ANKLE Right    with sedation   ORIF ANKLE FRACTURE Right 09/11/2021   Procedure: OPEN REDUCTION INTERNAL FIXATION (ORIF) ANKLE FRACTURE;  Surgeon: Willaim Sheng, MD;  Location: [facility];  Service: Orthopedics;  Laterality: Right;    There were no vitals filed for this visit.   Subjective Assessment - 11/25/21 0719     Subjective Saw MD yesterday yesterday and he said the popping is because my ligament is healing. I got an ASO from the MD for weaning as tolerated .    Currently in Pain? No/denies    Pain Score 0-No pain   can be 3-4/10 with end of day at work in the boot   Pain Location Ankle            [facility] Adult PT Treatment/Exercise:  Gait: (without Cam boot) and with single crutch 120 ft x2  with step through pattern Parallel bars- retro stepping , side stepping, forward gait   Therapeutic Exercise: - standing gastroc stretch  -standing on Airex narrow with EC 30 sec x 2  -standing on airex with alternating march  - tandem on level surface 30 sec each foot - --green band long sitting 4 way ankle x 20 each-  ---passive ankle  stretching - all planes -seated Baps L3 A/P , inv/EV , then circles , all 10 each  -seated heel raise with 8# resting on knee x 20      Interventions NOT performed today-   - Nustep L 5 LE only x 5 minutes  -seated toe raises x 10- cues for eccentric control  -seated toe scrunches, toe yoga - seated isometric ball squeeze for ankle inversion 5 sec x 10  -Seated prostretch -AAROM  10 sec x 10 reps -supine SLR x 10 x 2 Side lying hip abduction x 10 x 2  -prone hip extension 10 x 2 each -feet on floor standing balance with alternating step forward - UE on parallel bars- - seated toe raises x 10  -seated gastroc stretch  -seated towel slides Inversion and eversion x 10  x 1 each way  2# on towels          PT Short Term Goals - 11/03/21 1454       PT SHORT TERM GOAL #1   Title Pt will beInd in an initail HEP    Baseline started on eval    Period Weeks    Status On-going  PT SHORT TERM GOAL #2   Title Pt will be able to ascend/descend 12 steps c crutches for improved community mobility    Baseline 11/03/21- can navigate with railing and one crutch - 4 steps    Status Partially Met               PT Long Term Goals - 10/24/21 1459       PT LONG TERM GOAL #1   Title Improve R ankle ROM to wiht 80% of the L for improve functional mobility    Baseline see flowsheets    Status New    Target Date 12/12/21      PT LONG TERM GOAL #2   Title Improve R ankle strength to 4+ for improved functional mobility    Baseline se flowsheets    Status New    Target Date 12/12/21      PT LONG TERM GOAL #3   Title Pt will be able to walk 1070ft s an AD and mild limp over the R LE for community ambulation and return to her job    Status New    Target Date 12/12/21      PT LONG TERM GOAL #4   Title Pt will be able to ascend/desend 24 steps c handrail as needed for community mobility and return to her job    Status New    Target Date 12/12/21      PT LONG TERM GOAL #5    Title Set additional functional goals for the r ankle as indicated    Status New    Target Date 12/12/21                   Plan - 11/25/21 0948     Clinical Impression Statement Pt reports she has been without boot in home most of the time and wears boot to work.  She has mild antalgic pattern present with bilaterl shoes and no crutches so recommended continued use of crutch when without boot. Worked on closed chain balance and gait as tolerated. Continued open chain ankle strength with progression to green band. She declined ice at end of session today.    PT Treatment/Interventions ADLs/Self Care Home Management;Aquatic Therapy;Cryotherapy;Electrical Stimulation;Ultrasound;Moist Heat;Iontophoresis 4mg /ml Dexamethasone;Gait training;Stair training;Functional mobility training;Therapeutic activities;Therapeutic exercise;Balance training;Neuromuscular re-education;Manual techniques;Patient/family education;Passive range of motion;Dry needling;Taping;Joint Manipulations    PT Next Visit Plan Assess resonse to HEP, progress per WB status (currently boot at work, no boot and single crutch otherwise )- pt to bring opposite shoe to appts    PT Home Exercise Plan [id number]             Patient will benefit from skilled therapeutic intervention in order to improve the following deficits and impairments:  Abnormal gait, Difficulty walking, Decreased range of motion, Decreased activity tolerance, Obesity, Pain, Increased edema, Decreased strength, Decreased balance  Visit Diagnosis: Decreased range of motion of right ankle  Muscle weakness (generalized)  Difficulty in walking, not elsewhere classified  Status post open reduction with internal fixation (ORIF) of fracture of ankle     Problem List Patient Active Problem List   Diagnosis Date Noted   Migraine without aura and without status migrainosus, not intractable 05/01/2013   Morbid obesity ([place]) 05/01/2013    Dorene Ar, PTA 11/25/2021, 10:01 AM  [facility] [address] [place], [place], [address] Phone: [phone]   Fax:  [phone]  Name: ARELIA VOLPE MRN: [id number] Date of Birth:  04/01/2001 ° ° ° °

## 2021-11-26 ENCOUNTER — Ambulatory Visit: Payer: Medicaid Other | Admitting: Physical Therapy

## 2021-11-26 ENCOUNTER — Encounter: Payer: Self-pay | Admitting: Physical Therapy

## 2021-11-26 DIAGNOSIS — R262 Difficulty in walking, not elsewhere classified: Secondary | ICD-10-CM

## 2021-11-26 DIAGNOSIS — M25671 Stiffness of right ankle, not elsewhere classified: Secondary | ICD-10-CM

## 2021-11-26 DIAGNOSIS — M6281 Muscle weakness (generalized): Secondary | ICD-10-CM

## 2021-11-26 DIAGNOSIS — Z9889 Other specified postprocedural states: Secondary | ICD-10-CM

## 2021-11-26 NOTE — Therapy (Signed)
[facility] [address] [place], [place], [address] Phone: [phone]   Fax:  [phone]  Physical Therapy Treatment  Patient Details  Name: Beverly Hawkins MRN: [id number] Date of Birth: 2001/07/08 Referring Provider (PT): Joen Laura, MD   Encounter Date: 11/26/2021   PT End of Session - 11/26/21 1028     Visit Number 9    Number of Visits 13    Date for PT Re-Evaluation 12/12/21    Authorization Type [place] MEDICAID AMERIHEALTH CARITAS OF [place]- no auth for first 12 visits    PT Start Time 1015    PT Stop Time 1056    PT Time Calculation (min) 41 min             Past Medical History:  Diagnosis Date   Headache(784.0)    Obesity    Reflux gastritis    Walker as ambulation aid    and crutches    Past Surgical History:  Procedure Laterality Date   MANIPULATION ANKLE Right    with sedation   ORIF ANKLE FRACTURE Right 09/11/2021   Procedure: OPEN REDUCTION INTERNAL FIXATION (ORIF) ANKLE FRACTURE;  Surgeon: Joen Laura, MD;  [facility];  Service: Orthopedics;  Laterality: Right;    There were no vitals filed for this visit.   Subjective Assessment - 11/26/21 1017     Subjective My ankle is sore. I was able to take a shower instead of bath for the first time.    Currently in Pain? Yes    Pain Score 5     Pain Location Ankle    Pain Orientation Right    Pain Descriptors / Indicators Sore    Pain Type Chronic pain    Aggravating Factors  transition to weight bearing    Pain Relieving Factors rest, elevation.             [facility] Adult PT Treatment/Exercise:  Gait: in ASO , no AD   Therapeutic Exercise: all closed chain therex performed with ASO donned  - standing gastroc stretch  -standing soleus stretch - tandem on level surface 30 sec each foot - -up and down 16 steps (6 inch) with 1 HR up and 2 HR down- alternating pattern  -leg press 40# x 20  -toe press 40# x 10  -standing  heel raise x 10  -standing toe raise x10  --green band long sitting 4 way ankle x 20 each-  ---passive ankle stretching - all planes      Interventions NOT performed today-   - Nustep L 5 LE only x 5 minutes  -seated toe raises x 10- cues for eccentric control  -seated toe scrunches, toe yoga - seated isometric ball squeeze for ankle inversion 5 sec x 10  -Seated prostretch -AAROM  10 sec x 10 reps -supine SLR x 10 x 2 Side lying hip abduction x 10 x 2  -prone hip extension 10 x 2 each -feet on floor standing balance with alternating step forward - UE on parallel bars- - seated toe raises x 10  -seated gastroc stretch  -seated towel slides Inversion and eversion x 10  x 1 each way  2# on towels -standing on Airex narrow with EC 30 sec x 2  -standing on airex with alternating march  -seated Baps L3 A/P , inv/EV , then circles , all 10 each  -seated heel raise with 8# resting on knee x 20  PT Short Term Goals - 11/26/21 1029       PT SHORT TERM GOAL #1   Title Pt will beInd in an initail HEP    Baseline started on eval;    Period Weeks    Status Achieved    Target Date 11/14/21      PT SHORT TERM GOAL #2   Title Pt will be able to ascend/descend 12 steps c crutches for improved community mobility    Baseline 11/03/21- can navigate with railing and one crutch - 4 steps; 11/26/21- able to navigate stairs with one HR, no AD, and ASO.    Period Weeks    Status Achieved    Target Date 11/14/21               PT Long Term Goals - 10/24/21 1459       PT LONG TERM GOAL #1   Title Improve R ankle ROM to wiht 80% of the L for improve functional mobility    Baseline see flowsheets    Status New    Target Date 12/12/21      PT LONG TERM GOAL #2   Title Improve R ankle strength to 4+ for improved functional mobility    Baseline se flowsheets    Status New    Target Date 12/12/21      PT LONG TERM GOAL #3   Title Pt will be able to walk 1038ft s an AD and  mild limp over the R LE for community ambulation and return to her job    Status New    Target Date 12/12/21      PT LONG TERM GOAL #4   Title Pt will be able to ascend/desend 24 steps c handrail as needed for community mobility and return to her job    Status New    Target Date 12/12/21      PT LONG TERM GOAL #5   Title Set additional functional goals for the r ankle as indicated    Status New    Target Date 12/12/21                   Plan - 11/26/21 1115     Clinical Impression Statement Pt reports some soreness today in her ankle and attributes it to getting up and down from her low couch without her boot. She brought her ASO today and the session was performed with ASO donned and she tolerated progression to closed chain ankle strengthening without increased pain.    PT Treatment/Interventions ADLs/Self Care Home Management;Aquatic Therapy;Cryotherapy;Electrical Stimulation;Ultrasound;Moist Heat;Iontophoresis 4mg /ml Dexamethasone;Gait training;Stair training;Functional mobility training;Therapeutic activities;Therapeutic exercise;Balance training;Neuromuscular re-education;Manual techniques;Patient/family education;Passive range of motion;Dry needling;Taping;Joint Manipulations    PT Next Visit Plan Assess resonse to HEP, progress per WB status (currently weaning from boot to ASO.    PT Home Exercise Plan [id number]             Patient will benefit from skilled therapeutic intervention in order to improve the following deficits and impairments:  Abnormal gait, Difficulty walking, Decreased range of motion, Decreased activity tolerance, Obesity, Pain, Increased edema, Decreased strength, Decreased balance  Visit Diagnosis: Decreased range of motion of right ankle  Muscle weakness (generalized)  Difficulty in walking, not elsewhere classified  Status post open reduction with internal fixation (ORIF) of fracture of ankle     Problem List Patient Active Problem List    Diagnosis Date Noted   Migraine without aura and without status migrainosus, not intractable 05/01/2013  Morbid obesity ([facility]) 05/01/2013    Sherrie Mustache, PTA 11/26/2021, 11:20 AM  [facility] [address] [place], [place], [address] Phone: [phone]   Fax:  [phone]  Name: Beverly Hawkins MRN: [id number] Date of Birth: 2001-04-05

## 2021-12-02 ENCOUNTER — Ambulatory Visit: Payer: Medicaid Other | Admitting: Physical Therapy

## 2021-12-02 ENCOUNTER — Other Ambulatory Visit: Payer: Self-pay

## 2021-12-02 ENCOUNTER — Encounter: Payer: Self-pay | Admitting: Physical Therapy

## 2021-12-02 DIAGNOSIS — R262 Difficulty in walking, not elsewhere classified: Secondary | ICD-10-CM

## 2021-12-02 DIAGNOSIS — M25671 Stiffness of right ankle, not elsewhere classified: Secondary | ICD-10-CM

## 2021-12-02 DIAGNOSIS — M6281 Muscle weakness (generalized): Secondary | ICD-10-CM

## 2021-12-02 DIAGNOSIS — Z9889 Other specified postprocedural states: Secondary | ICD-10-CM

## 2021-12-02 NOTE — Therapy (Signed)
[facility] Outpatient Rehabilitation [facility] [address] [place], [place], [address] Phone: [phone]   Fax:  [phone]  Physical Therapy Treatment  Patient Details  Name: Beverly Hawkins MRN: [id number] Date of Birth: 2001-08-08 Referring Provider (PT): Joen Laura, MD   Encounter Date: 12/02/2021   PT End of Session - 12/02/21 1155     Visit Number 10    Number of Visits 13    Date for PT Re-Evaluation 12/12/21    Authorization Type [place] MEDICAID [employer] OF [place]- no auth for first 12 visits    Progress Note Due on Visit 10    PT Start Time 1150    PT Stop Time 1230    PT Time Calculation (min) 40 min             Past Medical History:  Diagnosis Date   Headache(784.0)    Obesity    Reflux gastritis    Walker as ambulation aid    and crutches    Past Surgical History:  Procedure Laterality Date   MANIPULATION ANKLE Right    with sedation   ORIF ANKLE FRACTURE Right 09/11/2021   Procedure: OPEN REDUCTION INTERNAL FIXATION (ORIF) ANKLE FRACTURE;  Surgeon: Joen Laura, MD;  Location: [facility] OR;  Service: Orthopedics;  Laterality: Right;    There were no vitals filed for this visit.   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 I walked some at work with the ASO- the first couple of hours and then but the boot back on.  The inside ankle bone felt like it was going to push out and feels hot. Pain increased to 10/10 due to working 3 days in a row.    Currently in Pain? Yes    Pain Score 4     Pain Location Ankle    Pain Orientation Right    Pain Descriptors / Indicators Sore    Pain Type Chronic pain    Aggravating Factors  transition  to weightbearing, working consecutive days.    Pain Relieving Factors rest, elevation             [facility] Adult PT Treatment/Exercise:    Therapeutic Exercise:  -T.M 1.0 mph - cues for step length and heel  strike - standing gastroc stretch  -standing soleus stretch- increased anterior ankle pain - tandem on airex surface 30 sec each foot - ---passive ankle stretching  -passive soleus stretching --standing heel raise x 10  -standing toe raise x10       PT Short Term Goals - 11/26/21 1029       PT SHORT TERM GOAL #1   Title Pt will beInd in an initail HEP    Baseline started on eval;    Period Weeks    Status Achieved    Target Date 11/14/21      PT SHORT TERM GOAL #2   Title Pt will be able to ascend/descend 12 steps c crutches for improved community mobility    Baseline 11/03/21- can navigate with railing and one crutch - 4 steps; 11/26/21- able to navigate stairs with one HR, no AD, and ASO.    Period Weeks    Status Achieved    Target Date 11/14/21               PT Long Term Goals - 10/24/21 1459       PT  LONG TERM GOAL #1   Title Improve R ankle ROM to wiht 80% of the L for improve functional mobility    Baseline see flowsheets    Status New    Target Date 12/12/21      PT LONG TERM GOAL #2   Title Improve R ankle strength to 4+ for improved functional mobility    Baseline se flowsheets    Status New    Target Date 12/12/21      PT LONG TERM GOAL #3   Title Pt will be able to walk 1022ft s an AD and mild limp over the R LE for community ambulation and return to her job    Status New    Target Date 12/12/21      PT LONG TERM GOAL #4   Title Pt will be able to ascend/desend 24 steps c handrail as needed for community mobility and return to her job    Status New    Target Date 12/12/21      PT LONG TERM GOAL #5   Title Set additional functional goals for the r ankle as indicated    Status New    Target Date 12/12/21                   Plan - 12/02/21 1214     Clinical Impression Statement Pt reports increased pain of 10/10 after 3 days of working. She used ASO for a couple of hours a day and then cam boot mostly. She notes that brace is very  tight above ankle and is causing ankle to swell more. Requested that she keep top of brace loose so not to increased ankle edema. Began with T.M walking for gait training and continued with closed chain ankle strength and stability. Reviewed closed chain calf stretches. She has ankle pain with soleus stretching.    PT Treatment/Interventions ADLs/Self Care Home Management;Aquatic Therapy;Cryotherapy;Electrical Stimulation;Ultrasound;Moist Heat;Iontophoresis 4mg /ml Dexamethasone;Gait training;Stair training;Functional mobility training;Therapeutic activities;Therapeutic exercise;Balance training;Neuromuscular re-education;Manual techniques;Patient/family education;Passive range of motion;Dry needling;Taping;Joint Manipulations    PT Next Visit Plan Assess resonse to HEP, progress per WB status (currently weaning from boot to ASO).    PT Home Exercise Plan [id number]             Patient will benefit from skilled therapeutic intervention in order to improve the following deficits and impairments:  Abnormal gait, Difficulty walking, Decreased range of motion, Decreased activity tolerance, Obesity, Pain, Increased edema, Decreased strength, Decreased balance  Visit Diagnosis: Decreased range of motion of right ankle  Difficulty in walking, not elsewhere classified  Status post open reduction with internal fixation (ORIF) of fracture of ankle  Muscle weakness (generalized)     Problem List Patient Active Problem List   Diagnosis Date Noted   Migraine without aura and without status migrainosus, not intractable 05/01/2013   Morbid obesity (HCC) 05/01/2013    05/03/2013, PTA 12/02/2021, 1:17 PM  [facility] [address] [place], [place], [place] Phone: [phone]   Fax:  [phone]  Name: Beverly Hawkins MRN: [name] Date of Birth: 2001-10-08

## 2021-12-04 ENCOUNTER — Ambulatory Visit: Payer: Medicaid Other

## 2021-12-04 ENCOUNTER — Other Ambulatory Visit: Payer: Self-pay

## 2021-12-04 DIAGNOSIS — M25671 Stiffness of right ankle, not elsewhere classified: Secondary | ICD-10-CM | POA: Diagnosis not present

## 2021-12-04 DIAGNOSIS — Z9889 Other specified postprocedural states: Secondary | ICD-10-CM

## 2021-12-04 DIAGNOSIS — R262 Difficulty in walking, not elsewhere classified: Secondary | ICD-10-CM

## 2021-12-04 DIAGNOSIS — M6281 Muscle weakness (generalized): Secondary | ICD-10-CM

## 2021-12-04 NOTE — Therapy (Signed)
[facility] Outpatient Rehabilitation [facility] [address] [place], [place], [address] Phone: [phone]   Fax:  [phone]  Physical Therapy Treatment  Patient Details  Name: Beverly Hawkins MRN: [id number] Date of Birth: [birth year] Referring Provider (PT): Joen Laura, MD   Encounter Date: 12/04/2021   PT End of Session - 12/04/21 1017     Visit Number 11    Number of Visits 13    Date for PT Re-Evaluation 12/12/21    Authorization Type [place] MEDICAID [employer] OF [place]- no auth for first 12 visits    Progress Note Due on Visit 10    PT Start Time 1015    PT Stop Time 1100    PT Time Calculation (min) 45 min    Activity Tolerance --   min increase in pain            Past Medical History:  Diagnosis Date   Headache(784.0)    Obesity    Reflux gastritis    Walker as ambulation aid    and crutches    Past Surgical History:  Procedure Laterality Date   MANIPULATION ANKLE Right    with sedation   ORIF ANKLE FRACTURE Right 09/11/2021   Procedure: OPEN REDUCTION INTERNAL FIXATION (ORIF) ANKLE FRACTURE;  Surgeon: Joen Laura, MD;  Location: [facility] OR;  Service: Orthopedics;  Laterality: Right;    There were no vitals filed for this visit.   Subjective Assessment - 12/04/21 1024     Subjective Pt reports she is doing well today with her currently not experiencing R ankle pain.    Patient Stated Goals To be be able to walk again. To be able to return to work where she goes up and down steps.    Currently in Pain? No/denies                        Therapeutic Exercise:  - T.M .8 to 1.2 mph,- cues for step length and heel strike, pt decreased L step length dueto R ankle soreness - standing gastroc stretch, x3, 30" - standing soleus stretch- increased anterior ankle pain,  - tandem on airex surface 30 sec each foot, EO - Lateral step ups to airex, 2x10 c 1 hand assist - standing heel raise 2 x 10  - standing toe  raise 2 x10  -leg press 60# 2x10   Manual: - AP talocrural mobs grade lll, contract/relax                           PT Short Term Goals - 11/26/21 1029       PT SHORT TERM GOAL #1   Title Pt will beInd in an initail HEP    Baseline started on eval;    Period Weeks    Status Achieved    Target Date 11/14/21      PT SHORT TERM GOAL #2   Title Pt will be able to ascend/descend 12 steps c crutches for improved community mobility    Baseline 11/03/21- can navigate with railing and one crutch - 4 steps; 11/26/21- able to navigate stairs with one HR, no AD, and ASO.    Period Weeks    Status Achieved    Target Date 11/14/21               PT Long Term Goals - 10/24/21 1459       PT LONG TERM GOAL #  1   Title Improve R ankle ROM to wiht 80% of the L for improve functional mobility    Baseline see flowsheets    Status New    Target Date 12/12/21      PT LONG TERM GOAL #2   Title Improve R ankle strength to 4+ for improved functional mobility    Baseline se flowsheets    Status New    Target Date 12/12/21      PT LONG TERM GOAL #3   Title Pt will be able to walk 1059ft s an AD and mild limp over the R LE for community ambulation and return to her job    Status New    Target Date 12/12/21      PT LONG TERM GOAL #4   Title Pt will be able to ascend/desend 24 steps c handrail as needed for community mobility and return to her job    Status New    Target Date 12/12/21      PT LONG TERM GOAL #5   Title Set additional functional goals for the r ankle as indicated    Status New    Target Date 12/12/21                   Plan - 12/04/21 1427     Clinical Impression Statement PT was completed to address R ankle DF ROM and ankle stability. Pt consistently reports anterior ankle soreness at the endrange for DF. AP talocrural mobs and contract/relax strtching were completed. After which pt noted her ankle ROM felt better wen walking. Pt reported a  min increase in R ankle pain after today's session. Pt declined and cold pack for her R ankle staing she would use one when got home.    Personal Factors and Comorbidities Comorbidity 1    Comorbidities obesity    Stability/Clinical Decision Making Stable/Uncomplicated    Clinical Decision Making Low    Rehab Potential Good    PT Duration 6 weeks    PT Treatment/Interventions ADLs/Self Care Home Management;Aquatic Therapy;Cryotherapy;Electrical Stimulation;Ultrasound;Moist Heat;Iontophoresis 4mg /ml Dexamethasone;Gait training;Stair training;Functional mobility training;Therapeutic activities;Therapeutic exercise;Balance training;Neuromuscular re-education;Manual techniques;Patient/family education;Passive range of motion;Dry needling;Taping;Joint Manipulations    PT Next Visit Plan Assess resonse to HEP, progress per WB status (currently weaning from boot to ASO). Address DF ROM    PT Home Exercise Plan [id number]    Consulted and Agree with Plan of Care Patient             Patient will benefit from skilled therapeutic intervention in order to improve the following deficits and impairments:  Abnormal gait, Difficulty walking, Decreased range of motion, Decreased activity tolerance, Obesity, Pain, Increased edema, Decreased strength, Decreased balance  Visit Diagnosis: Decreased range of motion of right ankle  Difficulty in walking, not elsewhere classified  Status post open reduction with internal fixation (ORIF) of fracture of ankle  Muscle weakness (generalized)     Problem List Patient Active Problem List   Diagnosis Date Noted   Migraine without aura and without status migrainosus, not intractable 05/01/2013   Morbid obesity (HCC) 05/01/2013    05/03/2013 MS, PT 12/04/21 2:38 PM   [facility] Outpatient Rehabilitation [facility] [address] [place], [place], [place] Phone: [phone]   Fax:  [phone]  Name: Beverly Hawkins MRN: [name] Date  of Birth: [birth year]

## 2021-12-08 ENCOUNTER — Ambulatory Visit: Payer: Medicaid Other | Attending: Orthopedic Surgery | Admitting: Physical Therapy

## 2021-12-08 ENCOUNTER — Encounter: Payer: Self-pay | Admitting: Physical Therapy

## 2021-12-08 ENCOUNTER — Other Ambulatory Visit: Payer: Self-pay

## 2021-12-08 DIAGNOSIS — Z8781 Personal history of (healed) traumatic fracture: Secondary | ICD-10-CM | POA: Insufficient documentation

## 2021-12-08 DIAGNOSIS — M25671 Stiffness of right ankle, not elsewhere classified: Secondary | ICD-10-CM | POA: Diagnosis present

## 2021-12-08 DIAGNOSIS — R262 Difficulty in walking, not elsewhere classified: Secondary | ICD-10-CM | POA: Insufficient documentation

## 2021-12-08 DIAGNOSIS — Z9889 Other specified postprocedural states: Secondary | ICD-10-CM | POA: Insufficient documentation

## 2021-12-08 DIAGNOSIS — M6281 Muscle weakness (generalized): Secondary | ICD-10-CM | POA: Insufficient documentation

## 2021-12-08 NOTE — Therapy (Signed)
[facility] Outpatient Rehabilitation [facility] [address] [place], [place], [address] Phone: [phone]   Fax:  [phone]  Physical Therapy Treatment  Patient Details  Name: Beverly Hawkins MRN: [id number] Date of Birth: 2001/12/05 Referring Provider (PT): Joen Laura, MD   Encounter Date: 12/08/2021   PT End of Session - 12/08/21 1448     Visit Number 12    Number of Visits 13    Date for PT Re-Evaluation 12/12/21    Authorization Type [place] MEDICAID [employer] OF [place]- no auth for first 12 visits,    Progress Note Due on Visit 10    PT Start Time 1443    PT Stop Time 1528    PT Time Calculation (min) 45 min             Past Medical History:  Diagnosis Date   Headache(784.0)    Obesity    Reflux gastritis    Walker as ambulation aid    and crutches    Past Surgical History:  Procedure Laterality Date   MANIPULATION ANKLE Right    with sedation   ORIF ANKLE FRACTURE Right 09/11/2021   Procedure: OPEN REDUCTION INTERNAL FIXATION (ORIF) ANKLE FRACTURE;  Surgeon: Joen Laura, MD;  Location: [facility] OR;  Service: Orthopedics;  Laterality: Right;    There were no vitals filed for this visit.   Subjective Assessment - 12/08/21 1446     Subjective I have been working on the exercises and still feel the discomfort in the front of the ankle. I turned the ankle a little and then sat down. I walked barefoot some at home. Today was first day back to work since Christmas.  and did not have to get up alot.    Currently in Pain? No/denies             herapeutic Exercise:  - T.M .8 to 1.3 mph, GRADE 1 - cues for step length and heel strike - Retro gait on T.M level surface x 3 minutes  - standing gastroc stretch,- mobs with movement using Green Theraband at ankle - standing soleus stretchmobs with movement using green theraband at ankle- limited tolerance   - tandem stance level surface x 30 sec  - standing heel raise 2 x 10  -  standing toe raise 2 x10  -leg press 60# 2x10 , single leg 40# -calf press 40# -difficult  Manual: - AP talocrural mobs grade lll, contract/relax, passive ankle ROM              PT Short Term Goals - 11/26/21 1029       PT SHORT TERM GOAL #1   Title Pt will beInd in an initail HEP    Baseline started on eval;    Period Weeks    Status Achieved    Target Date 11/14/21      PT SHORT TERM GOAL #2   Title Pt will be able to ascend/descend 12 steps c crutches for improved community mobility    Baseline 11/03/21- can navigate with railing and one crutch - 4 steps; 11/26/21- able to navigate stairs with one HR, no AD, and ASO.    Period Weeks    Status Achieved    Target Date 11/14/21               PT Long Term Goals - 10/24/21 1459       PT LONG TERM GOAL #1   Title Improve R ankle ROM to wiht  80% of the L for improve functional mobility    Baseline see flowsheets    Status New    Target Date 12/12/21      PT LONG TERM GOAL #2   Title Improve R ankle strength to 4+ for improved functional mobility    Baseline se flowsheets    Status New    Target Date 12/12/21      PT LONG TERM GOAL #3   Title Pt will be able to walk 1067ft s an AD and mild limp over the R LE for community ambulation and return to her job    Status New    Target Date 12/12/21      PT LONG TERM GOAL #4   Title Pt will be able to ascend/desend 24 steps c handrail as needed for community mobility and return to her job    Status New    Target Date 12/12/21      PT LONG TERM GOAL #5   Title Set additional functional goals for the r ankle as indicated    Status New    Target Date 12/12/21                   Plan - 12/08/21 1535     Clinical Impression Statement Pt arrives in CAM boot and no AD. SHe has been off work for several days and went back today but was not busy. SHe has no pain on arrival. She reports difficulty with anterior ankle pain and discomfort with gastroc and  soleus stretches, used theraband for ankle mobs with movement. She continues to have most discomfort with soleus stretching. Continued with manual to improved ankle ROM. She will continue to wean off boot and into ASO full time.    PT Treatment/Interventions ADLs/Self Care Home Management;Aquatic Therapy;Cryotherapy;Electrical Stimulation;Ultrasound;Moist Heat;Iontophoresis 4mg /ml Dexamethasone;Gait training;Stair training;Functional mobility training;Therapeutic activities;Therapeutic exercise;Balance training;Neuromuscular re-education;Manual techniques;Patient/family education;Passive range of motion;Dry needling;Taping;Joint Manipulations    PT Next Visit Plan Assess resonse to HEP, progress per WB status (currently weaning from boot to ASO). Address DF ROM (does she need re-eval if her benefits started over)    PT Home Exercise Plan [id number]             Patient will benefit from skilled therapeutic intervention in order to improve the following deficits and impairments:  Abnormal gait, Difficulty walking, Decreased range of motion, Decreased activity tolerance, Obesity, Pain, Increased edema, Decreased strength, Decreased balance  Visit Diagnosis: Decreased range of motion of right ankle  Status post open reduction with internal fixation (ORIF) of fracture of ankle  Muscle weakness (generalized)  Difficulty in walking, not elsewhere classified     Problem List Patient Active Problem List   Diagnosis Date Noted   Migraine without aura and without status migrainosus, not intractable 05/01/2013   Morbid obesity (HCC) 05/01/2013    05/03/2013, PTA 12/08/2021, 3:38 PM  [facility] Outpatient Rehabilitation [facility] [address] [place], [place], [place] Phone: [phone]   Fax:  [phone]  Name: VERBENA BOEDING MRN: [name] Date of Birth: Jul 03, 2001

## 2021-12-10 ENCOUNTER — Ambulatory Visit: Payer: Medicaid Other

## 2021-12-10 ENCOUNTER — Other Ambulatory Visit: Payer: Self-pay

## 2021-12-10 DIAGNOSIS — M6281 Muscle weakness (generalized): Secondary | ICD-10-CM

## 2021-12-10 DIAGNOSIS — Z8781 Personal history of (healed) traumatic fracture: Secondary | ICD-10-CM

## 2021-12-10 DIAGNOSIS — M25671 Stiffness of right ankle, not elsewhere classified: Secondary | ICD-10-CM | POA: Diagnosis not present

## 2021-12-10 DIAGNOSIS — Z9889 Other specified postprocedural states: Secondary | ICD-10-CM

## 2021-12-10 DIAGNOSIS — R262 Difficulty in walking, not elsewhere classified: Secondary | ICD-10-CM

## 2021-12-10 NOTE — Therapy (Signed)
[place], [place], [address] Phone: [phone]   Fax:  [phone]  Physical Therapy Treatment/ERO/Re-Auth  Patient Details  Name: Beverly Hawkins MRN: [id number] Date of Birth: Dec 28, 2000 Referring Provider (PT): Willaim Sheng, MD   Encounter Date: 12/10/2021   PT End of Session - 12/10/21 1504     Visit Number 13    Number of Visits 13    Date for PT Re-Evaluation 02/12/22    Authorization Type [place] MEDICA[employer] [place]- no auth for first 12 visits,    Progress Note Due on Visit 10    PT Start Time 1503    PT Stop Time 1544    PT Time Calculation (min) 41 min    Activity Tolerance Patient tolerated treatment well    Behavior During Therapy WFL for tasks assessed/performed             Past Medical History:  Diagnosis Date   Headache(784.0)    Obesity    Reflux gastritis    Walker as ambulation aid    and crutches    Past Surgical History:  Procedure Laterality Date   MANIPULATION ANKLE Right    with sedation   ORIF ANKLE FRACTURE Right 09/11/2021   Procedure: OPEN REDUCTION INTERNAL FIXATION (ORIF) ANKLE FRACTURE;  Surgeon: Willaim Sheng, MD;  Location: [place];  Service: Orthopedics;  Laterality: Right;    There were no vitals filed for this visit.   Subjective Assessment - 12/10/21 1512     Subjective Pt rpeorts she has not been up on her feet as much and her ankle is not hurting much.    Patient Stated Goals To be be able to walk again. To be able to return to work where she goes up and down steps.    Currently in Pain? Yes    Pain Score 2     Pain Location Ankle    Pain Orientation Right    Pain Descriptors / Indicators Sore    Pain Type Chronic pain    Pain Onset More than a month ago    Pain Frequency Occasional    Aggravating Factors  Prolonged time on my feet    Pain Relieving Factors Rest, elevation, cold pack                [facility] PT  Assessment - 12/11/21 0001       AROM   Right Ankle Dorsiflexion 6    Right Ankle Plantar Flexion 35    Right Ankle Inversion 35    Right Ankle Eversion 36      Strength   Right Ankle Dorsiflexion 4+/5    Right Ankle Plantar Flexion --   0 reps   Right Ankle Inversion 4/5    Right Ankle Eversion 4+/5    Left Ankle Plantar Flexion --   10 reps                         TREATMENT 12/10/2021:  Therapeutic Exercise:  - Walking in gym s ankle brace. Pt tolerated 472f befoe needing to sit and rest related to r ankle pain. - standing gastroc stretch,- mobs with movement using Green Theraband at ankle - standing soleus stretchmobs with movement using green theraband at ankle- limited tolerance   - tandem stance level surface x 30 sec, x2, each foot back position - standing heel raise 2 x 10  - standing toe raise  2 x10  - leg press 60# 2x10 , single leg 40#  Therapeutic activity: - SLS, R unable to complete s L hand assist. R x3, 30" c L hand assist; L x3, 30" s hand assist        PT Short Term Goals - 11/26/21 1029       PT SHORT TERM GOAL #1   Title Pt will beInd in an initail HEP    Baseline started on eval;    Period Weeks    Status Achieved    Target Date 11/14/21      PT SHORT TERM GOAL #2   Title Pt will be able to ascend/descend 12 steps c crutches for improved community mobility    Baseline 11/03/21- can navigate with railing and one crutch - 4 steps; 11/26/21- able to navigate stairs with one HR, no AD, and ASO.    Period Weeks    Status Achieved    Target Date 11/14/21               PT Long Term Goals - 12/11/21 1141       PT LONG TERM GOAL #1   Title Improve R ankle ROM to wiht 80% of the L for improve functional mobility.     12/10/21: Partially met: Met for Inv and ev, not met for DF and PF.    Baseline see flowsheets    Status Partially Met    Target Date 02/12/22      PT LONG TERM GOAL #2   Title Improve R ankle strength to 4+  for improved functional mobility. Revised goal- R SL PF = 5x c 1 hand assist.    12/10/21: partially met- met for DF and ev, not met for PF and Inv    Baseline L SL PF= 10x, R SL PF= 0x    Status Partially Met    Target Date 02/12/22      PT LONG TERM GOAL #3   Title Pt will be able to walk 1064f s an AD and mild limp over the R LE for community ambulation and return to her job.  12/10/21: 406f   Status On-going    Target Date 02/12/22      PT LONG TERM GOAL #4   Title Pt will be able to ascend/desend 24 steps c handrail as needed for community mobility and return to her job. 12/10/21: ascends 8 steps c 2 hand A alternating; desends 8 steps c 2 hand assist, 1 step at a time.    Status On-going    Target Date 02/12/22      PT LONG TERM GOAL #5   Title Set additional functional goals for the r ankle as indicated. Revised 12/10/21: R SLS time of 10sec for improved balance and function.    Baseline R SLS time of 0 sec    Status Revised    Target Date 02/12/22                   Plan - 12/11/21 [id number]   Clinical Impression Statement Pt presented to PT today not wearing the cam boot. Pt is wearing her ASO ankle brace. PT was completed  for therex to improve R ankle ROM and strength, and to improve functional mobility. Pt has made appropriate progress with strength and ROM with deficits still present, see flowsheets. Pt has progressed out of the cam boot with improving walking walk tolerance which has allowed her to return to work,  Due to remaining strength deficits, pt is not able to PF nor SLS on her R LE impacting balance and functional mobility. Pt will benefit from continued skilled PT to address her current deficits for optimized functional mobility with less pain.    Personal Factors and Comorbidities Comorbidity 1    Comorbidities obesity    Stability/Clinical Decision Making Stable/Uncomplicated    Clinical Decision Making Low    Rehab Potential Good    PT Frequency 1x / week     PT Duration 8 weeks    PT Treatment/Interventions ADLs/Self Care Home Management;Aquatic Therapy;Cryotherapy;Electrical Stimulation;Ultrasound;Moist Heat;Iontophoresis 55m/ml Dexamethasone;Gait training;Stair training;Functional mobility training;Therapeutic activities;Therapeutic exercise;Balance training;Neuromuscular re-education;Manual techniques;Patient/family education;Passive range of motion;Dry needling;Taping;Joint Manipulations    PT Next Visit Plan Assess resonse to HEP, progress per WB status (currently weaning from boot to ASO). Address DF ROM (does she need re-eval if her benefits started over)    PT Home Exercise Plan [id number]    Consulted and Agree with Plan of Care Patient             Patient will benefit from skilled therapeutic intervention in order to improve the following deficits and impairments:  Abnormal gait, Difficulty walking, Decreased range of motion, Decreased activity tolerance, Obesity, Pain, Increased edema, Decreased strength, Decreased balance  Visit Diagnosis: Decreased range of motion of right ankle  Status post open reduction with internal fixation (ORIF) of fracture of ankle  Muscle weakness (generalized)  Difficulty in walking, not elsewhere classified     Problem List Patient Active Problem List   Diagnosis Date Noted   Migraine without aura and without status migrainosus, not intractable 05/01/2013   Morbid [place] ([place] 05/01/2013    AGar [place] PT 12/11/2021, 3:38 PM  [facility] [address]Phone: [phone]  Fax:  [phone] Name: Beverly BERGESMRN: [id number] of Birth: [birth year]

## 2021-12-14 ENCOUNTER — Ambulatory Visit: Payer: Medicaid Other | Admitting: Physical Therapy

## 2021-12-16 ENCOUNTER — Ambulatory Visit: Payer: Medicaid Other | Admitting: Physical Therapy

## 2021-12-23 ENCOUNTER — Ambulatory Visit: Payer: Medicaid Other | Admitting: Physical Therapy

## 2021-12-30 ENCOUNTER — Encounter: Payer: Self-pay | Admitting: Physical Therapy

## 2021-12-30 ENCOUNTER — Other Ambulatory Visit: Payer: Self-pay

## 2021-12-30 ENCOUNTER — Ambulatory Visit: Payer: Medicaid Other | Admitting: Physical Therapy

## 2021-12-30 DIAGNOSIS — M6281 Muscle weakness (generalized): Secondary | ICD-10-CM

## 2021-12-30 DIAGNOSIS — M25671 Stiffness of right ankle, not elsewhere classified: Secondary | ICD-10-CM

## 2021-12-30 DIAGNOSIS — R262 Difficulty in walking, not elsewhere classified: Secondary | ICD-10-CM

## 2021-12-30 DIAGNOSIS — Z9889 Other specified postprocedural states: Secondary | ICD-10-CM

## 2021-12-30 NOTE — Therapy (Signed)
[place], [place], [address] Phone: [phone]   Fax:  [phone]  Physical Therapy Treatment  Patient Details  Name: Beverly Hawkins MRN: [id number] Date of Birth: Feb 20, 2001 Referring Provider (PT): Willaim Sheng, MD   Encounter Date: 12/30/2021   PT End of Session - 12/30/21 0906     Visit Number 14    Number of Visits 21    Date for PT Re-Evaluation 02/12/22    Authorization Type [place] MEDICAID [employer] [place]-    Authorization Time Period 12/16/21-02/02/22    Authorization - Visit Number 1    Authorization - Number of Visits 8    Progress Note Due on Visit --    PT Start Time 0900   pt 15 minutes late   PT Stop Time 0938    PT Time Calculation (min) 38 min             Past Medical History:  Diagnosis Date   Headache(784.0)    Obesity    Reflux gastritis    Walker as ambulation aid    and crutches    Past Surgical History:  Procedure Laterality Date   MANIPULATION ANKLE Right    with sedation   ORIF ANKLE FRACTURE Right 09/11/2021   Procedure: OPEN REDUCTION INTERNAL FIXATION (ORIF) ANKLE FRACTURE;  Surgeon: Willaim Sheng, MD;  Location: [place];  Service: Orthopedics;  Laterality: Right;    There were no vitals filed for this visit.   Subjective Assessment - 12/30/21 0902     Subjective Pt reports improvement in standing and walking time prior to beginning to limp, up to 1-2 hours depending on activity. Pain at worst gets to 7/10 with extended time on fett. Average daily pain 3-4/10.    Currently in Pain? No/denies    Pain Score 0-No pain    Pain Location Ankle                [facility] PT Assessment - 12/30/21 0001       AROM   Right Ankle Dorsiflexion 6    Right Ankle Plantar Flexion 35              TREATMENT 12/30/2021:  Therapeutic Exercise: - forward step down-retro step up on stairs (4 inch) , single UE -lateral step downs (inch) -bilat  UE -bilat heel raise x 20 -Leg press 60# bil, 30# single leg -slant board stretch bilat   Manual Therapy: - N/A  Neuromuscular re-ed: - SLS 3 sec best Right, tandem stance 15 sec   Therapeutic Activity: - stair climbing reciprocal to ascend -1 HR, needs bilat UE to descend stairs with apprehension and decreased eccentric control   Self-care/Home Management: - N/A          PT Short Term Goals - 11/26/21 1029       PT SHORT TERM GOAL #1   Title Pt will beInd in an initail HEP    Baseline started on eval;    Period Weeks    Status Achieved    Target Date 11/14/21      PT SHORT TERM GOAL #2   Title Pt will be able to ascend/descend 12 steps c crutches for improved community mobility    Baseline 11/03/21- can navigate with railing and one crutch - 4 steps; 11/26/21- able to navigate stairs with one HR, no AD, and ASO.    Period Weeks    Status Achieved    Target Date  11/14/21               PT Long Term Goals - 12/11/21 1141       PT LONG TERM GOAL #1   Title Improve R ankle ROM to wiht 80% of the L for improve functional mobility.     12/10/21: Partially met: Met for Inv and ev, not met for DF and PF.    Baseline see flowsheets    Status Partially Met    Target Date 02/12/22      PT LONG TERM GOAL #2   Title Improve R ankle strength to 4+ for improved functional mobility. Revised goal- R SL PF = 5x c 1 hand assist.    12/10/21: partially met- met for DF and ev, not met for PF and Inv    Baseline L SL PF= 10x, R SL PF= 0x    Status Partially Met    Target Date 02/12/22      PT LONG TERM GOAL #3   Title Pt will be able to walk 1028f s an AD and mild limp over the R LE for community ambulation and return to her job.  12/10/21: 4064f   Status On-going    Target Date 02/12/22      PT LONG TERM GOAL #4   Title Pt will be able to ascend/desend 24 steps c handrail as needed for community mobility and return to her job. 12/10/21: ascends 8 steps c 2 hand A alternating;  desends 8 steps c 2 hand assist, 1 step at a time.    Status On-going    Target Date 02/12/22      PT LONG TERM GOAL #5   Title Set additional functional goals for the r ankle as indicated. Revised 12/10/21: R SLS time of 10sec for improved balance and function.    Baseline R SLS time of 0 sec    Status Revised    Target Date 02/12/22                   Plan - 12/30/21 1010     Clinical Impression Statement Pt notes improvement in activity tolerance however pain can reach 7/10 with activity longer than 1-2 hours. Her SLS has improved from 0 sec to 3 sec on right LE. Her DF ROM is the same, 6 degrees. She voices difficulty descending stairs so worked with step downs on stairs in clinic which require Mod UE assist. Encouraged contiued calf strength, SLS and stretching.    PT Treatment/Interventions ADLs/Self Care Home Management;Aquatic Therapy;Cryotherapy;Electrical Stimulation;Ultrasound;Moist Heat;Iontophoresis 42m2ml Dexamethasone;Gait training;Stair training;Functional mobility training;Therapeutic activities;Therapeutic exercise;Balance training;Neuromuscular re-education;Manual techniques;Patient/family education;Passive range of motion;Dry needling;Taping;Joint Manipulations    PT Next Visit Plan Assess resonse to HEP, progress per WB status (currently weaning from boot to ASO). Address DF ROM    PT Home Exercise Plan [id number]             Patient will benefit from skilled therapeutic intervention in order to improve the following deficits and impairments:  Abnormal gait, Difficulty walking, Decreased range of motion, Decreased activity tolerance, Obesity, Pain, Increased edema, Decreased strength, Decreased balance  Visit Diagnosis: Decreased range of motion of right ankle  Status post open reduction with internal fixation (ORIF) of fracture of ankle  Muscle weakness (generalized)  Difficulty in walking, not elsewhere classified     Problem List Patient Active  Problem List   Diagnosis Date Noted   Migraine without aura and without status migrainosus, not intractable 05/01/2013  Morbid obesity ([place]) 05/01/2013    Dorene Ar, PTA 12/30/2021, 10:13 AM  [facility] [address] [place], [place], [address] Phone: [phone]   Fax:  [phone]  Name: Beverly Hawkins MRN: [id number] Date of Birth: 2001/10/15

## 2022-01-06 ENCOUNTER — Encounter: Payer: Self-pay | Admitting: Physical Therapy

## 2022-01-06 ENCOUNTER — Ambulatory Visit: Payer: Medicaid Other | Attending: Orthopedic Surgery | Admitting: Physical Therapy

## 2022-01-06 ENCOUNTER — Other Ambulatory Visit: Payer: Self-pay

## 2022-01-06 DIAGNOSIS — M6281 Muscle weakness (generalized): Secondary | ICD-10-CM | POA: Diagnosis present

## 2022-01-06 DIAGNOSIS — R262 Difficulty in walking, not elsewhere classified: Secondary | ICD-10-CM | POA: Diagnosis present

## 2022-01-06 DIAGNOSIS — Z8781 Personal history of (healed) traumatic fracture: Secondary | ICD-10-CM | POA: Insufficient documentation

## 2022-01-06 DIAGNOSIS — Z9889 Other specified postprocedural states: Secondary | ICD-10-CM | POA: Diagnosis present

## 2022-01-06 DIAGNOSIS — M25671 Stiffness of right ankle, not elsewhere classified: Secondary | ICD-10-CM | POA: Diagnosis present

## 2022-01-06 NOTE — Therapy (Signed)
[place], [place], [address] Phone: [phone]   Fax:  [phone]  Physical Therapy Treatment  Patient Details  Name: Beverly Hawkins MRN: [id number] Date of Birth: 09/16/2001 Referring Provider (PT): Willaim Sheng, MD   Encounter Date: 01/06/2022   PT End of Session - 01/06/22 0901     Visit Number 15    Number of Visits 21    Date for PT Re-Evaluation 02/12/22    Authorization Type [place] MEDICAID [employer] [place]-    Authorization Time Period 12/16/21-02/02/22    Authorization - Visit Number 2    Authorization - Number of Visits 8    Progress Note Due on Visit 10    PT Start Time 0857   13 min late   PT Stop Time 0930    PT Time Calculation (min) 33 min             Past Medical History:  Diagnosis Date   Headache(784.0)    Obesity    Reflux gastritis    Walker as ambulation aid    and crutches    Past Surgical History:  Procedure Laterality Date   MANIPULATION ANKLE Right    with sedation   ORIF ANKLE FRACTURE Right 09/11/2021   Procedure: OPEN REDUCTION INTERNAL FIXATION (ORIF) ANKLE FRACTURE;  Surgeon: Willaim Sheng, MD;  Location: [place];  Service: Orthopedics;  Laterality: Right;    There were no vitals filed for this visit.   Subjective Assessment - 01/06/22 0859     Subjective Went to the [employer] in [place]. I did lots of walking and it did not hurt as bad. I wore my boot.    Currently in Pain? No/denies                [facility] PT Assessment - 01/06/22 0001       AROM   Right Ankle Dorsiflexion 7    Right Ankle Plantar Flexion 35      Strength   Right Ankle Inversion 4+/5   min pain   Right Ankle Eversion 4+/5            TREATMENT 12/30/2021:  Therapeutic Exercise: AROM/MMT taken -bilat hel raise x10 -Left single heel raise - rest breaks to reach 10 reps -Right single heel raise - min lift 1 rep -bilat concentric with right  eccentric x 10 - forward step down-retro step up on stairs (4 inch) , single UE -lateral step downs (inch) -bilat UE -SLS 3 sec beat right, 10 sec best Left  -squat to chair tap - weight shifting to RLE -bridge x 10 with DF -unable to perform single leg bridge on Right or left  -slant board stretch bilat   Manual Therapy: - N/A  Neuromuscular re-ed:   Therapeutic Activity: - stair climbing reciprocal to ascend -1 HR, needs bilat UE to descend stairs with apprehension and decreased eccentric control   Self-care/Home Management: - N/A         PT Short Term Goals - 11/26/21 1029       PT SHORT TERM GOAL #1   Title Pt will beInd in an initail HEP    Baseline started on eval;    Period Weeks    Status Achieved    Target Date 11/14/21      PT SHORT TERM GOAL #2   Title Pt will be able to ascend/descend 12 steps c crutches for improved community mobility  Baseline 11/03/21- can navigate with railing and one crutch - 4 steps; 11/26/21- able to navigate stairs with one HR, no AD, and ASO.    Period Weeks    Status Achieved    Target Date 11/14/21               PT Long Term Goals - 01/06/22 7517       PT LONG TERM GOAL #1   Title Improve R ankle ROM to wiht 80% of the L for improve functional mobility.     12/10/21: Partially met: Met for Inv and ev, not met for DF and PF.    Baseline see flowsheets    Period Weeks    Status Partially Met    Target Date 02/12/22      PT LONG TERM GOAL #2   Title Improve R ankle strength to 4+ for improved functional mobility. Revised goal- R SL PF = 5x c 1 hand assist.    12/10/21: partially met- met for DF and ev, not met for PF and Inv; 01/06/22: met for all except PF    Baseline L SL PF= 10x, R SL PF= 1x- min lift    Period Weeks    Status Partially Met    Target Date 02/12/22      PT LONG TERM GOAL #3   Title Pt will be able to walk 1073f s an AD and mild limp over the R LE for community ambulation and return to her job.   12/10/21: 4072f   Period Weeks    Status On-going    Target Date 02/12/22      PT LONG TERM GOAL #4   Title 12/10/21: ascends 8 steps c 2 hand A alternating; desends 8 steps c 2 hand assist, 1 step at a time; 01/06/22: can ascend with 1 HR, needs bilateral to descend alternating    Period Weeks    Status On-going    Target Date 02/12/22      PT LONG TERM GOAL #5   Title . Revised 12/10/21: R SLS time of 10sec for improved balance and function.    Baseline R SLS time of 0 sec; 01/06/22: 3 sec on right    Period Weeks    Status On-going    Target Date 02/12/22                   Plan - 01/06/22 1118     Clinical Impression Statement Pt arrives reporting increased activity on her feet but is still wearing Cam boot with all-day activities. Still unable to SLS >3 sec and unable to single leg heel raise. Still needs significant UE assist to descend stairs reciprocally. MMT has improved in all planes except PF.  Updated and issued HEP.    PT Treatment/Interventions ADLs/Self Care Home Management;Aquatic Therapy;Cryotherapy;Electrical Stimulation;Ultrasound;Moist Heat;Iontophoresis 63m73ml Dexamethasone;Gait training;Stair training;Functional mobility training;Therapeutic activities;Therapeutic exercise;Balance training;Neuromuscular re-education;Manual techniques;Patient/family education;Passive range of motion;Dry needling;Taping;Joint Manipulations    PT Next Visit Plan balance, calf strength, stair negotiation, squat    PT Home Exercise Plan [id number]             Patient will benefit from skilled therapeutic intervention in order to improve the following deficits and impairments:  Abnormal gait, Difficulty walking, Decreased range of motion, Decreased activity tolerance, Obesity, Pain, Increased edema, Decreased strength, Decreased balance  Visit Diagnosis: Decreased range of motion of right ankle  Status post open reduction with internal fixation (ORIF) of fracture of ankle  Muscle  weakness (  generalized)  Difficulty in walking, not elsewhere classified     Problem List Patient Active Problem List   Diagnosis Date Noted   Migraine without aura and without status migrainosus, not intractable 05/01/2013   Morbid obesity ([place]) 05/01/2013    Dorene Ar, PTA 01/06/2022, 11:26 AM  [facility] [address] [place], [place], [address] Phone: [phone]   Fax:  [phone]  Name: Beverly Hawkins MRN: [id number] Date of Birth: 07-Aug-2001

## 2022-01-13 ENCOUNTER — Encounter: Payer: Self-pay | Admitting: Physical Therapy

## 2022-01-13 ENCOUNTER — Other Ambulatory Visit: Payer: Self-pay

## 2022-01-13 ENCOUNTER — Ambulatory Visit: Payer: Medicaid Other | Admitting: Physical Therapy

## 2022-01-13 DIAGNOSIS — Z9889 Other specified postprocedural states: Secondary | ICD-10-CM

## 2022-01-13 DIAGNOSIS — M25671 Stiffness of right ankle, not elsewhere classified: Secondary | ICD-10-CM | POA: Diagnosis not present

## 2022-01-13 DIAGNOSIS — Z8781 Personal history of (healed) traumatic fracture: Secondary | ICD-10-CM

## 2022-01-13 NOTE — Therapy (Signed)
[place], [place], [address] Phone: [phone]   Fax:  [phone]  Physical Therapy Treatment  Patient Details  Name: Beverly Hawkins MRN: [id number] Date of Birth: 2001/01/27 Referring Provider (PT): Willaim Sheng, MD   Encounter Date: 01/13/2022   PT End of Session - 01/13/22 0851     Visit Number 16    Number of Visits 21    Date for PT Re-Evaluation 02/12/22    Authorization Type [place] MEDICAID [employer] [place]-    Authorization Time Period 12/16/21-02/02/22    Authorization - Visit Number 3    Authorization - Number of Visits 8    Progress Note Due on Visit 10    PT Start Time 6962    PT Stop Time 0925    PT Time Calculation (min) 38 min             Past Medical History:  Diagnosis Date   Headache(784.0)    Obesity    Reflux gastritis    Walker as ambulation aid    and crutches    Past Surgical History:  Procedure Laterality Date   MANIPULATION ANKLE Right    with sedation   ORIF ANKLE FRACTURE Right 09/11/2021   Procedure: OPEN REDUCTION INTERNAL FIXATION (ORIF) ANKLE FRACTURE;  Surgeon: Willaim Sheng, MD;  Location: [facility];  Service: Orthopedics;  Laterality: Right;    There were no vitals filed for this visit.   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 I can reach both handrails and I am getting better at alternating my feet.    Currently in Pain? No/denies    Pain Score 0-No pain    Pain Location Ankle              TREATMENT 12/30/2021:  Therapeutic Exercise:  -bilat heel raise x15 -bilat  heel raise concentric with right eccentric weight shift.  -tandem stance EC- SLS , 3 sec best on right -on airex: SLS with 2 finger assist -slant board stretch bilat  - forward step down-retro step up on stairs (4 inch) , single UE -bridge x 15 with  DF -sit-stand x 10 -squat to chair tap - weight shifting to RLE -3 way hip bilateral x 15 each   Manual Therapy: - N/A  Neuromuscular re-ed: -N/A  Therapeutic Activity: - stair climbing reciprocal to ascend -0 HR, needs bilat UE to descend stairs with improved control and less apprehension.  Self-care/Home Management: - N/A      PT Short Term Goals - 11/26/21 1029       PT SHORT TERM GOAL #1   Title Pt will beInd in an initail HEP    Baseline started on eval;    Period Weeks    Status Achieved    Target Date 11/14/21      PT SHORT TERM GOAL #2   Title Pt will be able to ascend/descend 12 steps c crutches for improved community mobility    Baseline 11/03/21- can navigate with railing and one crutch - 4 steps; 11/26/21- able to navigate stairs with one HR, no AD, and ASO.    Period Weeks    Status Achieved    Target Date 11/14/21               PT Long Term Goals - 01/13/22 9528  PT LONG TERM GOAL #1   Title Improve R ankle ROM to wiht 80% of the L for improve functional mobility.     12/10/21: Partially met: Met for Inv and ev, not met for DF and PF.    Baseline see flowsheets    Period Weeks    Status Partially Met    Target Date 02/12/22      PT LONG TERM GOAL #2   Title Improve R ankle strength to 4+ for improved functional mobility. Revised goal- R SL PF = 5x c 1 hand assist.    12/10/21: partially met- met for DF and ev, not met for PF and Inv; 01/06/22: met for all except PF    Baseline L SL PF= 10x, R SL PF= 1x- min lift    Period Weeks    Status Partially Met      PT LONG TERM GOAL #3   Title Pt will be able to walk 89f s an AD and mild limp over the R LE for community ambulation and return to her job.  12/10/21: 407f   Period Weeks    Status On-going    Target Date 02/12/22      PT LONG TERM GOAL #4   Title Will ascend /descend 24 stairs with hadnrails as needed for retrun to work. 12/10/21: ascends 8 steps c 2 hand A alternating; desends 8  steps c 2 hand assist, 1 step at a time; 01/06/22: can ascend with 1 HR, needs bilateral to descend alternating; 01/13/22: can ascend and descend with bilat handrails and good safety/control.    Period Weeks    Status Achieved      PT LONG TERM GOAL #5   Title . Revised 12/10/21: R SLS time of 10sec for improved balance and function.    Baseline R SLS time of 0 sec; 01/06/22: 3 sec on right    Period Weeks    Status On-going    Target Date 02/12/22                   Plan - 01/13/22 0917     Clinical Impression Statement Pt arrives reporting compliance with HEP. Her SLS has not improved however her stair negotiation has improved with decreased apprehension and improved control on descent. LTG# 4 met. 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 . She tolerated the session well.    PT Treatment/Interventions ADLs/Self Care Home Management;Aquatic Therapy;Cryotherapy;Electrical Stimulation;Ultrasound;Moist Heat;Iontophoresis 34m82ml Dexamethasone;Gait training;Stair training;Functional mobility training;Therapeutic activities;Therapeutic exercise;Balance training;Neuromuscular re-education;Manual techniques;Patient/family education;Passive range of motion;Dry needling;Taping;Joint Manipulations    PT Next Visit Plan balance, calf strength, stair negotiation, squat    PT Home Exercise Plan [id number]    Consulted and Agree with Plan of Care Patient             Patient will benefit from skilled therapeutic intervention in order to improve the following deficits and impairments:  Abnormal gait, Difficulty walking, Decreased range of motion, Decreased activity tolerance, Obesity, Pain, Increased edema, Decreased strength, Decreased balance  Visit Diagnosis: Decreased range of motion of right ankle  Status post open reduction with internal fixation (ORIF) of fracture of ankle     Problem  List Patient Active Problem List   Diagnosis Date Noted   Migraine without aura and without status migrainosus, not intractable 05/01/2013   Morbid obesity ([facility]5/27/2014    JesHessie DienerTA 01/13/22 9:24 AM  Phone: [phone] Fax: [facility] [address] [place], [place], [address] Phone: [phone]   Fax:  [phone]  Name: Beverly Hawkins MRN: [id number] Date of Birth: 08/04/2001

## 2022-01-20 ENCOUNTER — Encounter: Payer: Self-pay | Admitting: Physical Therapy

## 2022-01-20 ENCOUNTER — Ambulatory Visit: Payer: Medicaid Other | Admitting: Physical Therapy

## 2022-01-20 ENCOUNTER — Other Ambulatory Visit: Payer: Self-pay

## 2022-01-20 DIAGNOSIS — Z8781 Personal history of (healed) traumatic fracture: Secondary | ICD-10-CM

## 2022-01-20 DIAGNOSIS — Z9889 Other specified postprocedural states: Secondary | ICD-10-CM

## 2022-01-20 DIAGNOSIS — M25671 Stiffness of right ankle, not elsewhere classified: Secondary | ICD-10-CM | POA: Diagnosis not present

## 2022-01-20 DIAGNOSIS — R262 Difficulty in walking, not elsewhere classified: Secondary | ICD-10-CM

## 2022-01-20 DIAGNOSIS — M6281 Muscle weakness (generalized): Secondary | ICD-10-CM

## 2022-01-20 NOTE — Therapy (Addendum)
[place], [place], [address] Phone: [phone]   Fax:  [phone]  Physical Therapy Treatment  Patient Details  Name: Beverly Hawkins MRN: [id number] Date of Birth: 2001/07/28 Referring Provider (PT): Willaim Sheng, MD   Encounter Date: 01/20/2022   PT End of Session - 01/20/22 0901     Visit Number 17    Number of Visits 21    Date for PT Re-Evaluation 02/12/22    Authorization Type [place] MEDICAID [employer] OF [place]-    Authorization Time Period 12/16/21-02/02/22    Authorization - Visit Number 4    Authorization - Number of Visits 8    Progress Note Due on Visit 10    PT Start Time 0855   10 minutes late   PT Stop Time 0929    PT Time Calculation (min) 34 min             Past Medical History:  Diagnosis Date   Headache(784.0)    Obesity    Reflux gastritis    Walker as ambulation aid    and crutches    Past Surgical History:  Procedure Laterality Date   MANIPULATION ANKLE Right    with sedation   ORIF ANKLE FRACTURE Right 09/11/2021   Procedure: OPEN REDUCTION INTERNAL FIXATION (ORIF) ANKLE FRACTURE;  Surgeon: Willaim Sheng, MD;  Location: [facility];  Service: Orthopedics;  Laterality: Right;    There were no vitals filed for this visit.   Subjective Assessment - 01/20/22 0857     Subjective I walked up a steep incline with just my brace on. I have been practicing going down the stairs with one hand rail. I did alot this weekend , went to a festival and went out alot this weekend. I was active for 4 hours without sitting.    Currently in Pain? No/denies    Pain Score 0-No pain    Pain Location Ankle            Therapeutic Exercise: -stair negotiation with 1 HR -- forward step down-retro step up on stairs (4 inch) , single UE x 10 -bilat heel raise x15 -bilat heel raise off 2 inch step x 15 -bilat  heel raise concentric with right eccentric weight shift.   -tandem stance EC-5 sec best  SLS , 10 sec best on right, 16 sec left  -on airex: SLS with 2 finger assist -slant board stretch bilat  -squat to chair tap - weight shifting to RLE -seated heel raise with 10# on knee x 20 right  Manual Therapy: - N/A  Neuromuscular re-ed: -N/A  Therapeutic Activity: - stair climbing reciprocal to ascend -0 HR, needs 1 UE to descend stairs with improved control and less apprehension.  Self-care/Home Management: - N/A     [facility] PT Assessment - 01/20/22 0001       AROM   Right Ankle Dorsiflexion 7    Right Ankle Plantar Flexion 35                     PT Short Term Goals - 11/26/21 1029       PT SHORT TERM GOAL #1   Title Pt will beInd in an initail HEP    Baseline started on eval;    Period Weeks    Status Achieved    Target Date 11/14/21      PT SHORT TERM GOAL #2   Title Pt will be able to  ascend/descend 12 steps c crutches for improved community mobility    Baseline 11/03/21- can navigate with railing and one crutch - 4 steps; 11/26/21- able to navigate stairs with one HR, no AD, and ASO.    Period Weeks    Status Achieved    Target Date 11/14/21               PT Long Term Goals - 01/20/22 1016       PT LONG TERM GOAL #1   Title Improve R ankle ROM to wiht 80% of the L for improve functional mobility.     12/10/21: Partially met: Met for Inv and ev, not met for DF and PF.    Baseline lacks PF ROM (35 degrees)    Period Weeks    Status Partially Met    Target Date 02/12/22      PT LONG TERM GOAL #2   Title Improve R ankle strength to 4+ for improved functional mobility. Revised goal- R SL PF = 5x c 1 hand assist.    12/10/21: partially met- met for DF and ev, not met for PF and Inv; 01/06/22: met for all except PF    Baseline L SL PF= 10x, R SL PF= 1x- min lift    Period Weeks    Status Partially Met      PT LONG TERM GOAL #3   Title Pt will be able to walk 1024f s an AD and mild limp over the R LE for  community ambulation and return to her job.  12/10/21: 4082f 01/20/22: can attend festivals without limitation-up to 4 hours on feet    Period Weeks    Status Achieved      PT LONG TERM GOAL #4   Title Will ascend /descend 24 stairs with hadnrails as needed for retrun to work. 12/10/21: ascends 8 steps c 2 hand A alternating; desends 8 steps c 2 hand assist, 1 step at a time; 01/06/22: can ascend with 1 HR, needs bilateral to descend alternating; 01/13/22: can ascend and descend with bilat handrails and good safety/control.01/20/22: can descend stairs in clinic with 1 HR - is practicing this more at work.    Period Weeks    Status Achieved    Target Date 02/12/22      PT LONG TERM GOAL #5   Title . Revised 12/10/21: R SLS time of 10sec for improved balance and function.    Baseline R SLS time of 0 sec; 01/06/22: 3 sec on right; 01/20/22: complete 1 rep for 10 sec    Period Weeks    Status Partially Met    Target Date 02/12/22                   Plan - 01/20/22 0920     Clinical Impression Statement [name] made progress this week, increasing her SLS time on right LE to 10 sec, 1 rep. She is now able to descend the stairs with 1 UE. She is able to complete 4 hours of activity on her feet without sitting. Her PF strength and ROM are still limited. She has met or partially met all LTGs. She has one more scheduled visit.    PT Treatment/Interventions ADLs/Self Care Home Management;Aquatic Therapy;Cryotherapy;Electrical Stimulation;Ultrasound;Moist Heat;Iontophoresis 7m49ml Dexamethasone;Gait training;Stair training;Functional mobility training;Therapeutic activities;Therapeutic exercise;Balance training;Neuromuscular re-education;Manual techniques;Patient/family education;Passive range of motion;Dry needling;Taping;Joint Manipulations    PT Next Visit Plan balance, calf strength, stair negotiation, squat; recheckremianig goals and consider discharge    PT  Home Exercise Plan [id number]              Patient will benefit from skilled therapeutic intervention in order to improve the following deficits and impairments:  Abnormal gait, Difficulty walking, Decreased range of motion, Decreased activity tolerance, Obesity, Pain, Increased edema, Decreased strength, Decreased balance  Visit Diagnosis: Decreased range of motion of right ankle  Status post open reduction with internal fixation (ORIF) of fracture of ankle  Muscle weakness (generalized)  Difficulty in walking, not elsewhere classified     Problem List Patient Active Problem List   Diagnosis Date Noted   Migraine without aura and without status migrainosus, not intractable 05/01/2013   Morbid obesity ([facility] [place]) 05/01/2013   Hessie Diener, PTA 01/20/22 10:26 AM Phone: [phone] Fax: [facility] [address] [place], [place], [address] Phone: [phone]   Fax:  [phone]  Name: Beverly Hawkins MRN: [id number] Date of Birth: Feb 15, 2001

## 2022-01-27 ENCOUNTER — Other Ambulatory Visit: Payer: Self-pay

## 2022-01-27 ENCOUNTER — Ambulatory Visit: Payer: Medicaid Other

## 2022-01-27 DIAGNOSIS — Z8781 Personal history of (healed) traumatic fracture: Secondary | ICD-10-CM

## 2022-01-27 DIAGNOSIS — M6281 Muscle weakness (generalized): Secondary | ICD-10-CM

## 2022-01-27 DIAGNOSIS — M25671 Stiffness of right ankle, not elsewhere classified: Secondary | ICD-10-CM | POA: Diagnosis not present

## 2022-01-27 DIAGNOSIS — Z9889 Other specified postprocedural states: Secondary | ICD-10-CM

## 2022-01-27 DIAGNOSIS — R262 Difficulty in walking, not elsewhere classified: Secondary | ICD-10-CM

## 2022-01-27 NOTE — Therapy (Signed)
[place], [place], [address] Phone: [phone]   Fax:  [phone]  Physical Therapy Treatment/Re-Auth  Patient Details  Name: Beverly Hawkins MRN: [id number] Date of Birth: [birth year] Referring Provider (PT): Willaim Sheng, MD   Encounter Date: 01/27/2022   PT End of Session - 01/27/22 0852     Visit Number 17    Number of Visits 21    Date for PT Re-Evaluation 02/12/22    Authorization Type [place] MEDICAID [employer] OF [place]-    Authorization Time Period 12/16/21-02/02/22    Authorization - Visit Number 5    Authorization - Number of Visits 8    PT Start Time 0848    PT Stop Time 0930    PT Time Calculation (min) 42 min    Activity Tolerance Patient tolerated treatment well    Behavior During Therapy [facility] for tasks assessed/performed             Past Medical History:  Diagnosis Date   Headache(784.0)    Obesity    Reflux gastritis    Walker as ambulation aid    and crutches    Past Surgical History:  Procedure Laterality Date   MANIPULATION ANKLE Right    with sedation   ORIF ANKLE FRACTURE Right 09/11/2021   Procedure: OPEN REDUCTION INTERNAL FIXATION (ORIF) ANKLE FRACTURE;  Surgeon: Willaim Sheng, MD;  Location: [facility];  Service: Orthopedics;  Laterality: Right;    There were no vitals filed for this visit.   Subjective Assessment - 01/27/22 0855     Subjective I'm better, but not confident in completing more strenuous activities and exercises that I was doing doing before my injury (Dance/Zumba like exs). Also, my confidence with descending steps is still not where I woild like it to be. I have occasional brief pain with changing directions.    Pertinent History Obesity    Patient Stated Goals To be be able to walk again. To be able to return to work where she goes up and down steps.    Currently in Pain? No/denies    Pain Score 0-No pain    Pain Location Ankle     Pain Orientation Right             Therapeutic Exercise: -TM 1MPH, increased grade, 10 mins. Reports of post and ant R ankle tightness and pressure -Slant board 3x20 sec -forward step down-retro step up on stairs (4 inch) , single UE x 10 -bilat heel raise x15 -bilat heel raise off 2 inch step x 15 -bilat  heel raise concentric with right eccentric weight shift.    Therapeutic Activity: - stair climbing reciprocal to ascend -0 HR, needs 1 UE to descend stairs with improved control and less apprehension.        [facility] PT Assessment - 01/20/22 0001        01/27/22          AROM     Right Ankle Dorsiflexion 7  7    Right Ankle Plantar Flexion 35  38                                PT Education - 01/28/22 0836     Education Details Education for initial return to dance/zumba exercises completing step movements instead of dynamic plyometric movements    Person(s) Educated Patient    Methods Explanation  Comprehension Verbalized understanding              PT Short Term Goals - 01/28/22 1011       PT SHORT TERM GOAL #1   Title Pt will beInd in an initail HEP    Baseline started on eval;    Status Achieved    Target Date 11/14/21      PT SHORT TERM GOAL #2   Title Pt will be able to ascend/descend 12 steps c crutches for improved community mobility    Baseline 11/03/21- can navigate with railing and one crutch - 4 steps; 11/26/21- able to navigate stairs with one HR, no AD, and ASO.    Status Achieved    Target Date 11/14/21               PT Long Term Goals - 01/28/22 0942       PT LONG TERM GOAL #1   Title Improve R ankle ROM to wiht 80% of the L for improve functional mobility. 12/10/21: Partially met: Met for Inv and ev, not met for DF and PF.01/27/22=PF R 38d; L 50d    Baseline lacks PF ROM (35 degrees)    Status Partially Met    Target Date 03/19/22      PT LONG TERM GOAL #2   Title Improve R ankle strength to 4+ for improved  functional mobility. Revised goal- R SL PF = 5x c 1 hand assist.    12/10/21: partially met- met for DF and ev, not met for PF and Inv; 01/06/22: met for all except PF. 01/27/22= pt is able to complete partial R SL PF    Status Partially Met    Target Date 03/19/22      PT LONG TERM GOAL #3   Title Pt will be able to walk 1019f s an AD and mild limp over the R LE for community ambulation and return to her job.  12/10/21: 4073f 01/20/22: can attend festivals without limitation-up to 4 hours on feet    Status Achieved    Target Date 02/12/22      PT LONG TERM GOAL #4   Title Will ascend /descend 24 stairs with hadnrails as needed for retrun to work. 12/10/21: ascends 8 steps c 2 hand A alternating; desends 8 steps c 2 hand assist, 1 step at a time; 01/06/22: can ascend with 1 HR, needs bilateral to descend alternating; 01/13/22: can ascend and descend with bilat handrails and good safety/control.01/20/22: can descend stairs in clinic with 1 HR - is practicing this more at work, limited by decreased R ankle PF ROM    Status Achieved    Target Date 02/12/22      PT LONG TERM GOAL #5   Title . Revised 12/10/21: R SLS time of 10sec for 5 repetitions for improved balance and function.    Baseline R SLS time of 0 sec; 01/06/22: 3 sec on right; 01/20/22: complete 1 rep for 10 sec    Status Partially Met    Target Date 03/19/22      Additional Long Term Goals   Additional Long Term Goals --      PT LONG TERM GOAL #6   Title Pt will demostrate improved ability to desend steps with minimal 1 hand A for balance.    Baseline Marked hand A on HR for weigth bearing support    Status New    Target Date 03/19/22      PT LONG TERM GOAL #  7   Title Pt will be able to compete light dynamic movements with R ankle to initate return to her dance/zumba exercise program    Baseline unable to complete    Status New    Target Date 03/19/22                   Plan - 01/28/22 1013     Clinical Impression Statement Pt  has made good progress with her R ankle progressing with strength and ROM allowing for walking on flat surfaces, ascending/desending steps c HR assist, and increased walking and standing tolerance. With improvement, pt has been able to return back to work. Current limitations include decreased R ankle ROM (DF and PF), decreased ankle PF strength, desecending steps confidenttly, and decreased ability to complete dynamic R LE/ankle movements with her dance/zumba exercise programs. Pt will benefit from skilled PT 1w6 to address current deficits to optimize pt's R ankle function with return to dynamic higher level functional activities.    Personal Factors and Comorbidities Comorbidity 1    Comorbidities obesity    Stability/Clinical Decision Making Stable/Uncomplicated    Clinical Decision Making Low    Rehab Potential Good    PT Frequency 1x / week    PT Duration 8 weeks    PT Treatment/Interventions ADLs/Self Care Home Management;Aquatic Therapy;Cryotherapy;Electrical Stimulation;Ultrasound;Moist Heat;Iontophoresis 65m/ml Dexamethasone;Gait training;Stair training;Functional mobility training;Therapeutic activities;Therapeutic exercise;Balance training;Neuromuscular re-education;Manual techniques;Patient/family education;Passive range of motion;Dry needling;Taping;Joint Manipulations    PT Next Visit Plan balance, calf strength, stair negotiation, squat; stepping progressing to dynamic LE/ankle movement pattern    PT Home Exercise Plan [id number]    Consulted and Agree with Plan of Care Patient             Patient will benefit from skilled therapeutic intervention in order to improve the following deficits and impairments:  Abnormal gait, Difficulty walking, Decreased range of motion, Decreased activity tolerance, Obesity, Pain, Increased edema, Decreased strength, Decreased balance  Visit Diagnosis: Decreased range of motion of right ankle  Status post open reduction with internal fixation  (ORIF) of fracture of ankle  Muscle weakness (generalized)  Difficulty in walking, not elsewhere classified     Problem List Patient Active Problem List   Diagnosis Date Noted   Migraine without aura and without status migrainosus, not intractable 05/01/2013   Morbid obesity ([place] 05/01/2013    AGar PontoMS, PT 01/28/22 1:14 PM   [facility]GVern[facility] Center [place] [address]Phone: [phone]  Fax:  [phone] Name: Beverly KETTERMRN: [id number] of Birth: [birth year]

## 2022-02-01 ENCOUNTER — Ambulatory Visit: Payer: Medicaid Other | Admitting: Physical Therapy

## 2022-02-10 ENCOUNTER — Ambulatory Visit: Payer: Medicaid Other | Attending: Orthopedic Surgery | Admitting: Physical Therapy

## 2022-02-10 ENCOUNTER — Encounter: Payer: Self-pay | Admitting: Physical Therapy

## 2022-02-10 ENCOUNTER — Other Ambulatory Visit: Payer: Self-pay

## 2022-02-10 DIAGNOSIS — R262 Difficulty in walking, not elsewhere classified: Secondary | ICD-10-CM | POA: Diagnosis present

## 2022-02-10 DIAGNOSIS — Z8781 Personal history of (healed) traumatic fracture: Secondary | ICD-10-CM | POA: Insufficient documentation

## 2022-02-10 DIAGNOSIS — Z9889 Other specified postprocedural states: Secondary | ICD-10-CM | POA: Diagnosis present

## 2022-02-10 DIAGNOSIS — M25671 Stiffness of right ankle, not elsewhere classified: Secondary | ICD-10-CM | POA: Diagnosis not present

## 2022-02-10 DIAGNOSIS — M6281 Muscle weakness (generalized): Secondary | ICD-10-CM | POA: Diagnosis present

## 2022-02-10 NOTE — Therapy (Signed)
?[facility] ?[address] ?[place], [place], [address] ?Phone: [phone]   Fax:  [phone] ? ?Physical Therapy Treatment ? ?Patient Details  ?Name: Beverly Hawkins ?MRN: [id number] ?Date of Birth: 27-Oct-2001 ?Referring Provider (PT): Willaim Sheng, MD ? ? ?Encounter Date: 02/10/2022 ? ? PT End of Session - 02/10/22 0936   ? ? Visit Number 18   ? Number of Visits 21   ? Date for PT Re-Evaluation 02/12/22   ? Authorization Type [place] [employer]ICA[employer] [place]-   ? Authorization Time Period 01/29/22-03/26/22   ? Authorization - Visit Number 1   ? Authorization - Number of Visits 4   ? PT Start Time [phone]   ? PT Stop Time 1011   ? PT Time Calculation (min) 38 min   ? ?  ?  ? ?  ? ? ?Past Medical History:  ?Diagnosis Date  ? Headache(784.0)   ? Obesity   ? Reflux gastritis   ? Walker as ambulation aid   ? and crutches  ? ? ?Past Surgical History:  ?Procedure Laterality Date  ? MANIPULATION ANKLE Right   ? with sedation  ? ORIF ANKLE FRACTURE Right 09/11/2021  ? Procedure: OPEN REDUCTION INTERNAL FIXATION (ORIF) ANKLE FRACTURE;  Surgeon: Willaim Sheng, MD;  Location: [facility];  Service: Orthopedics;  Laterality: Right;  ? ? ?There were no vitals filed for this visit. ? ? Subjective Assessment - 02/10/22 0934   ? ? Subjective I can go without my brace short errands. Otherwise, I wear my ankle brace.   ? Currently in Pain? No/denies   ? Aggravating Factors  when busy at work   ? Pain Relieving Factors rest, elevation, brace   ? ?  ?  ? ?  ? ? ?Therapeutic Exercise: ? ?-Slant board 3x20 sec gastroc and soleus ?-forward step down-retro step up on stairs (4 inch) , single UE x 10 ?-bilat heel raise off airex x 20 ?-bilat  heel raise concentric with right eccentric weight shift.  ?Tandem 30  sec on foam RLE back  ?-SLS on oval needs light single UE touch ?-SLS from floor 10 sec right, 35 sec  ?  ?Therapeutic Activity: ?- stair climbing reciprocal to ascend -0 HR,  needs 1 UE to descend stairs with improved control and less apprehension ?-stepping for dynamic ankle movement - side stepping, cha cha cha, grapevines  ?-step up with opp knee drive 6inch without UE  for Single leg dynamic motion- needs intermittent UE touch.  ? ? ? ? ? ? ? ? ? ? ? ? ? ? PT Short Term Goals - 01/28/22 1011   ? ?  ? PT SHORT TERM GOAL #1  ? Title Pt will beInd in an initail HEP   ? Baseline started on eval;   ? Status Achieved   ? Target Date 11/14/21   ?  ? PT SHORT TERM GOAL #2  ? Title Pt will be able to ascend/descend 12 steps c crutches for improved community mobility   ? Baseline 11/03/21- can navigate with railing and one crutch - 4 steps; 11/26/21- able to navigate stairs with one HR, no AD, and ASO.   ? Status Achieved   ? Target Date 11/14/21   ? ?  ?  ? ?  ? ? ? ? PT Long Term Goals - 01/28/22 0942   ? ?  ? PT LONG TERM GOAL #1  ? Title Improve R ankle ROM to  wiht 80% of the L for improve functional mobility. 12/10/21: Partially met: Met for Inv and ev, not met for DF and PF.01/27/22=PF R 38d; L 50d   ? Baseline lacks PF ROM (35 degrees)   ? Status Partially Met   ? Target Date 03/19/22   ?  ? PT LONG TERM GOAL #2  ? Title Improve R ankle strength to 4+ for improved functional mobility. Revised goal- R SL PF = 5x c 1 hand assist.    12/10/21: partially met- met for DF and ev, not met for PF and Inv; 01/06/22: met for all except PF. 01/27/22= pt is able to complete partial R SL PF   ? Status Partially Met   ? Target Date 03/19/22   ?  ? PT LONG TERM GOAL #3  ? Title Pt will be able to walk 107f s an AD and mild limp over the R LE for community ambulation and return to her job.  12/10/21: 89f 01/20/22: can attend festivals without limitation-up to 4 hours on feet   ? Status Achieved   ? Target Date 02/12/22   ?  ? PT LONG TERM GOAL #4  ? Title Will ascend /descend 24 stairs with hadnrails as needed for retrun to work. 12/10/21: ascends 8 steps c 2 hand A alternating; desends 8 steps c 2 hand  assist, 1 step at a time; 01/06/22: can ascend with 1 HR, needs bilateral to descend alternating; 01/13/22: can ascend and descend with bilat handrails and good safety/control.01/20/22: can descend stairs in clinic with 1 HR - is practicing this more at work, limited by decreased R ankle PF ROM   ? Status Achieved   ? Target Date 02/12/22   ?  ? PT LONG TERM GOAL #5  ? Title . Revised 12/10/21: R SLS time of 10sec for 5 repetitions for improved balance and function.   ? Baseline R SLS time of 0 sec; 01/06/22: 3 sec on right; 01/20/22: complete 1 rep for 10 sec   ? Status Partially Met   ? Target Date 03/19/22   ?  ? Additional Long Term Goals  ? Additional Long Term Goals --   ?  ? PT LONG TERM GOAL #6  ? Title Pt will demostrate improved ability to desend steps with minimal 1 hand A for balance.   ? Baseline Marked hand A on HR for weigth bearing support   ? Status New   ? Target Date 03/19/22   ?  ? PT LONG TERM GOAL #7  ? Title Pt will be able to compete light dynamic movements with R ankle to initate return to her dance/zumba exercise program   ? Baseline unable to complete   ? Status New   ? Target Date 03/19/22   ? ?  ?  ? ?  ? ? ? ? ? ? ? ? Plan - 02/10/22 0936   ? ? Clinical Impression Statement Pt reports she can climb stairs without handrails. Descending stairs is improving and does not cause pain most of the time while using 1 hand rail. SLS 10 sec today x 2. Began dynamic stepping for progress toward LTGs. She did well with these. She is still limited with PF strength. Will continue to progress per POC.   ? PT Treatment/Interventions ADLs/Self Care Home Management;Aquatic Therapy;Cryotherapy;Electrical Stimulation;Ultrasound;Moist Heat;Iontophoresis 74m35ml Dexamethasone;Gait training;Stair training;Functional mobility training;Therapeutic activities;Therapeutic exercise;Balance training;Neuromuscular re-education;Manual techniques;Patient/family education;Passive range of motion;Dry needling;Taping;Joint  Manipulations   ? PT Next Visit Plan balance, calf  strength, stair negotiation, squat; stepping progressing to dynamic LE/ankle movement pattern   ? PT Home Exercise Plan [id number]   ? Consulted and Agree with Plan of Care Patient   ? ?  ?  ? ?  ? ? ?Patient will benefit from skilled therapeutic intervention in order to improve the following deficits and impairments:  Abnormal gait, Difficulty walking, Decreased range of motion, Decreased activity tolerance, Obesity, Pain, Increased edema, Decreased strength, Decreased balance ? ?Visit Diagnosis: ?Decreased range of motion of right ankle ? ?Status post open reduction with internal fixation (ORIF) of fracture of ankle ? ?Muscle weakness (generalized) ? ?Difficulty in walking, not elsewhere classified ? ? ? ? ?Problem List ?Patient Active Problem List  ? Diagnosis Date Noted  ? Migraine without aura and without status migrainosus, not intractable 05/01/2013  ? Morbid obesity ([place]) 05/01/2013  ? ? ?Dorene Ar, PTA ?02/10/2022, 12:13 PM ? ?[facility] ?[facility] ?[address] ?[place], [place], [address] ?Phone: [phone]   Fax:  [phone] ? ?Name: Beverly Hawkins ?MRN: [id number] ?Date of Birth: 12/23/2000 ? ? ? ?

## 2022-02-17 ENCOUNTER — Other Ambulatory Visit: Payer: Self-pay

## 2022-02-17 ENCOUNTER — Encounter: Payer: Self-pay | Admitting: Physical Therapy

## 2022-02-17 ENCOUNTER — Ambulatory Visit: Payer: Medicaid Other | Admitting: Physical Therapy

## 2022-02-17 DIAGNOSIS — Z8781 Personal history of (healed) traumatic fracture: Secondary | ICD-10-CM

## 2022-02-17 DIAGNOSIS — M25671 Stiffness of right ankle, not elsewhere classified: Secondary | ICD-10-CM | POA: Diagnosis not present

## 2022-02-17 DIAGNOSIS — R262 Difficulty in walking, not elsewhere classified: Secondary | ICD-10-CM

## 2022-02-17 DIAGNOSIS — M6281 Muscle weakness (generalized): Secondary | ICD-10-CM

## 2022-02-17 NOTE — Therapy (Signed)
[facility] ?[facility] ?[address] ?[place], [place], [address] ?Phone: [phone]   Fax:  [phone] ? ?Physical Therapy Treatment ? ?Patient Details  ?Name: Beverly Hawkins ?MRN: [id number] ?Date of Birth: Aug 27, 2001 ?Referring Provider (PT): Willaim Sheng, MD ? ? ?Encounter Date: 02/17/2022 ? ? PT End of Session - 02/17/22 0855   ? ? Visit Number 19   ? Number of Visits 21   ? Date for PT Re-Evaluation 02/12/22   ? Authorization Time Period 01/29/22-03/26/22   ? Authorization - Visit Number 2   ? Authorization - Number of Visits 4   ? Progress Note Due on Visit 10   ? PT Start Time 0848   ? PT Stop Time [phone]   ? PT Time Calculation (min) 33 min   ? ?  ?  ? ?  ? ? ?Past Medical History:  ?Diagnosis Date  ? Headache(784.0)   ? Obesity   ? Reflux gastritis   ? Walker as ambulation aid   ? and crutches  ? ? ?Past Surgical History:  ?Procedure Laterality Date  ? MANIPULATION ANKLE Right   ? with sedation  ? ORIF ANKLE FRACTURE Right 09/11/2021  ? Procedure: OPEN REDUCTION INTERNAL FIXATION (ORIF) ANKLE FRACTURE;  Surgeon: Willaim Sheng, MD;  Location: [facility];  Service: Orthopedics;  Laterality: Right;  ? ? ?There were no vitals filed for this visit. ? ? Subjective Assessment - 02/17/22 0849   ? ? Subjective I did two days in a row without the brace at work.  It was okay. I noticed I cannot change directions as fast.  I had to rest a little after the busy breakfast rush. I have a trip to [place] this weekend. I have a new pinch on the inside of the ankle.   ? Currently in Pain? No/denies   ? ?  ?  ? ?  ? ? ? ? ? ?Therapeutic Exercise: ?-SLS 10 sec, 10 sec, 12 sec, 14 sec, 15 sec  ?-Slant board 3x20 sec gastroc and soleus ?- bilat heel raises ?-bilat heel raise off 2 inch step x 20 ?-4inch right lateral step down x 15 - needs UE ?-forward step down-retro step up on stairs (4 inch) , single UE x 10 ? ? ? ? ?  ?Therapeutic Activity: ? ?  ?-step up with opp knee drive  6inch without UE  for Single leg dynamic motion- needs intermittent UE touch.  ? ? ? ? ? ? ? ? ? ? ? ? ? ? ? ? ? ? ? ? ? ? ? ? ? PT Short Term Goals - 01/28/22 1011   ? ?  ? PT SHORT TERM GOAL #1  ? Title Pt will beInd in an initail HEP   ? Baseline started on eval;   ? Status Achieved   ? Target Date 11/14/21   ?  ? PT SHORT TERM GOAL #2  ? Title Pt will be able to ascend/descend 12 steps c crutches for improved community mobility   ? Baseline 11/03/21- can navigate with railing and one crutch - 4 steps; 11/26/21- able to navigate stairs with one HR, no AD, and ASO.   ? Status Achieved   ? Target Date 11/14/21   ? ?  ?  ? ?  ? ? ? ? PT Long Term Goals - 02/17/22 0903   ? ?  ? PT LONG TERM GOAL #1  ? Title Improve R ankle ROM to wiht 80%  of the L for improve functional mobility. 12/10/21: Partially met: Met for Inv and ev, not met for DF and PF.01/27/22=PF R 38d; L 50d   ? Baseline lacks PF ROM (35 degrees)   ? Period Weeks   ? Status Partially Met   ? Target Date 03/19/22   ?  ? PT LONG TERM GOAL #2  ? Title Improve R ankle strength to 4+ for improved functional mobility. Revised goal- R SL PF = 5x c 1 hand assist.    12/10/21: partially met- met for DF and ev, not met for PF and Inv; 01/06/22: met for all except PF. 01/27/22= pt is able to complete partial R SL PF   ? Period Weeks   ? Status Partially Met   ? Target Date 03/19/22   ?  ? PT LONG TERM GOAL #3  ? Title Pt will be able to walk [age]f s an AD and mild limp over the R LE for community ambulation and return to her job.  12/10/21: [age]f 01/20/22: can attend festivals without limitation-up to 4 hours on feet   ? Period Weeks   ? Status Achieved   ? Target Date 02/12/22   ?  ? PT LONG TERM GOAL #4  ? Title Will ascend /descend 24 stairs with hadnrails as needed for retrun to work. 12/10/21: ascends 8 steps c 2 hand A alternating; desends 8 steps c 2 hand assist, 1 step at a time; 01/06/22: can ascend with 1 HR, needs bilateral to descend alternating; 01/13/22: can ascend  and descend with bilat handrails and good safety/control.01/20/22: can descend stairs in clinic with 1 HR - is practicing this more at work, limited by decreased R ankle PF ROM   ? Status Achieved   ? Target Date 02/12/22   ?  ? PT LONG TERM GOAL #5  ? Title . Revised 12/10/21: R SLS time of 10sec for 5 repetitions for improved balance and function.   ? Baseline 15 sec best 02/17/22, >10 sec x 5   ? Period Weeks   ? Status Achieved   ? Target Date 03/19/22   ?  ? PT LONG TERM GOAL #6  ? Title Pt will demostrate improved ability to desend steps with minimal 1 hand A for balance.   ? Baseline Marked hand A on HR for weigth bearing support; status: improving confidence   ? Period Weeks   ? Status On-going   ? Target Date 03/19/22   ?  ? PT LONG TERM GOAL #7  ? Title Pt will be able to compete light dynamic movements with R ankle to initate return to her dance/zumba exercise program   ? Baseline unable to complete; status: able to perform low level stepping/ line dancing in clinic   ? Period Weeks   ? Status On-going   ? Target Date 03/19/22   ? ?  ?  ? ?  ? ? ? ? ? ? ? ? Plan - 02/17/22 0921   ? ? Clinical Impression Statement Pt reports she is able to go without brace at work when they are not busy. She is able to perform SLS >/= 10 sec x 5 in clinic today which meets LTG#5. PF strength still biggest limitation. Continued with PF strengthening and balance. She reported a slight right knee pain during 4 inch step downs. Will continue to work toward remaining LTGS over last 2 visits.   ? PT Treatment/Interventions ADLs/Self Care Home Management;Aquatic Therapy;Cryotherapy;Electrical Stimulation;Ultrasound;Moist Heat;Iontophoresis 36m17ml Dexamethasone;Gait  training;Stair training;Functional mobility training;Therapeutic activities;Therapeutic exercise;Balance training;Neuromuscular re-education;Manual techniques;Patient/family education;Passive range of motion;Dry needling;Taping;Joint Manipulations   ? PT Next Visit Plan  LTG, squat, stairs, PF strength for completion of goals   ? PT Home Exercise Plan [id number]   ? Consulted and Agree with Plan of Care Patient   ? ?  ?  ? ?  ? ? ?Patient will benefit from skilled therapeutic intervention in order to improve the following deficits and impairments:  Abnormal gait, Difficulty walking, Decreased range of motion, Decreased activity tolerance, Obesity, Pain, Increased edema, Decreased strength, Decreased balance ? ?Visit Diagnosis: ?Decreased range of motion of right ankle ? ?Status post open reduction with internal fixation (ORIF) of fracture of ankle ? ?Muscle weakness (generalized) ? ?Difficulty in walking, not elsewhere classified ? ? ? ? ?Problem List ?Patient Active Problem List  ? Diagnosis Date Noted  ? Migraine without aura and without status migrainosus, not intractable 05/01/2013  ? Morbid obesity ([place]) 05/01/2013  ? ? ?Hessie Diener, PTA ?02/17/22 9:25 AM ?Phone: [phone] ?Fax: [phone]  ? ?[facility] ?[address] ?[place], [place], [address] ?Phone: [phone]   Fax:  [phone] ? ?Name: Beverly Hawkins ?MRN: [id number] ?Date of Birth: [birth year] ? ? ? ?

## 2022-02-24 ENCOUNTER — Encounter: Payer: Self-pay | Admitting: Physical Therapy

## 2022-02-24 ENCOUNTER — Ambulatory Visit: Payer: Medicaid Other | Admitting: Physical Therapy

## 2022-02-24 ENCOUNTER — Other Ambulatory Visit: Payer: Self-pay

## 2022-02-24 DIAGNOSIS — M25671 Stiffness of right ankle, not elsewhere classified: Secondary | ICD-10-CM | POA: Diagnosis not present

## 2022-02-24 DIAGNOSIS — Z9889 Other specified postprocedural states: Secondary | ICD-10-CM

## 2022-02-24 DIAGNOSIS — R262 Difficulty in walking, not elsewhere classified: Secondary | ICD-10-CM

## 2022-02-24 DIAGNOSIS — M6281 Muscle weakness (generalized): Secondary | ICD-10-CM

## 2022-02-24 NOTE — Therapy (Signed)
[facility] ?[facility] ?[address] ?[place], [place], [address] ?Phone: [phone]   Fax:  [phone] ? ?Physical Therapy Treatment ? ?Patient Details  ?Name: Beverly Hawkins ?MRN: [id number] ?Date of Birth: 02-03-2001 ?Referring Provider (PT): Willaim Sheng, MD ? ? ?Encounter Date: 02/24/2022 ? ? PT End of Session - 02/24/22 0856   ? ? Visit Number 20   ? Number of Visits 21   ? Date for PT Re-Evaluation 02/12/22   ? Authorization Type [place] MEDICA[employer] OF [place]-   ? Authorization Time Period 01/29/22-03/26/22   ? Authorization - Visit Number 3   ? Authorization - Number of Visits 4   ? Progress Note Due on Visit 10   ? PT Start Time [phone]   ? PT Stop Time [phone]   ? PT Time Calculation (min) 38 min   ? ?  ?  ? ?  ? ? ?Past Medical History:  ?Diagnosis Date  ? Headache(784.0)   ? Obesity   ? Reflux gastritis   ? Walker as ambulation aid   ? and crutches  ? ? ?Past Surgical History:  ?Procedure Laterality Date  ? MANIPULATION ANKLE Right   ? with sedation  ? ORIF ANKLE FRACTURE Right 09/11/2021  ? Procedure: OPEN REDUCTION INTERNAL FIXATION (ORIF) ANKLE FRACTURE;  Surgeon: Willaim Sheng, MD;  Location: [facility];  Service: Orthopedics;  Laterality: Right;  ? ? ?There were no vitals filed for this visit. ? ? Subjective Assessment - 02/24/22 0853   ? ? Subjective I have been doing more without the brace. I went to [employer] for 1 hour and did pretty good. I stopped wearing the brace to work. My pain at the worst can still be a 6/10 with intermittent 10/10 if I really push it.   ? Currently in Pain? No/denies   ? ?  ?  ? ?  ? ? ? ? ?Therapeutic Exercise: ?-stepping over hurdles- step to then reciprocal leading with each foot ?-side steppng over hurrdles e[address] way ?-SLS with opp  LE 3 taps on hurdle x 5  ?- SLS with light opp toe touch for ball toss and catching, rebounder toss ?- semit tandem on foam with rebounder toss ?-lateral shuffles 4 x 4 each way ?-hands on  wall SLS- step/ball/change x 5 each  ?- slant board stretch ?-bilat heel raise ?- unable to complete singel heel raise on right  ? ? ? ? ? ? ? ? ? PT Short Term Goals - 01/28/22 1011   ? ?  ? PT SHORT TERM GOAL #1  ? Title Pt will beInd in an initail HEP   ? Baseline started on eval;   ? Status Achieved   ? Target Date 11/14/21   ?  ? PT SHORT TERM GOAL #2  ? Title Pt will be able to ascend/descend 12 steps c crutches for improved community mobility   ? Baseline 11/03/21- can navigate with railing and one crutch - 4 steps; 11/26/21- able to navigate stairs with one HR, no AD, and ASO.   ? Status Achieved   ? Target Date 11/14/21   ? ?  ?  ? ?  ? ? ? ? PT Long Term Goals - 02/24/22 0946   ? ?  ? PT LONG TERM GOAL #1  ? Title Improve R ankle ROM to wiht 80% of the L for improve functional mobility. 12/10/21: Partially met: Met for Inv and ev, not met for DF and  PF.01/27/22=PF R 38d; L 50d   ? Baseline lacks PF ROM (35 degrees)   ? Period Weeks   ? Status Partially Met   ? Target Date 03/19/22   ?  ? PT LONG TERM GOAL #2  ? Title Improve R ankle strength to 4+ for improved functional mobility. Revised goal- R SL PF = 5x c 1 hand assist.    12/10/21: partially met- met for DF and ev, not met for PF and Inv; 01/06/22: met for all except PF. 01/27/22= pt is able to complete partial R SL PF   ? Baseline L SL PF= 10x, R SL PF= 1x- min lift   ? Period Weeks   ? Status Partially Met   ? Target Date 03/19/22   ?  ? PT LONG TERM GOAL #3  ? Title Pt will be able to walk 979f s an AD and mild limp over the R LE for community ambulation and return to her job.  12/10/21: 4047f 01/20/22: can attend festivals without limitation-up to 4 hours on feet   ? Period Weeks   ? Status Achieved   ? Target Date 02/12/22   ?  ? PT LONG TERM GOAL #4  ? Title Will ascend /descend 24 stairs with hadnrails as needed for retrun to work. 12/10/21: ascends 8 steps c 2 hand A alternating; desends 8 steps c 2 hand assist, 1 step at a time; 01/06/22: can ascend with  1 HR, needs bilateral to descend alternating; 01/13/22: can ascend and descend with bilat handrails and good safety/control.01/20/22: can descend stairs in clinic with 1 HR - is practicing this more at work, limited by decreased R ankle PF ROM   ? Period Weeks   ? Status Achieved   ? Target Date 02/12/22   ?  ? PT LONG TERM GOAL #5  ? Title . Revised 12/10/21: R SLS time of 10sec for 5 repetitions for improved balance and function.   ? Baseline 15 sec best 02/17/22, >10 sec x 5   ? Period Weeks   ? Status Achieved   ?  ? PT LONG TERM GOAL #6  ? Title Pt will demostrate improved ability to desend steps with minimal 1 hand A for balance.   ? Baseline min touch and improved apprehension   ? Period Weeks   ? Status Achieved   ? Target Date 03/19/22   ?  ? PT LONG TERM GOAL #7  ? Title Pt will be able to compete light dynamic movements with R ankle to initate return to her dance/zumba exercise program   ? Baseline able to comple line dance steps, lateral shuffles, light plyo at wall today   ? Period Weeks   ? Status Achieved   ? Target Date 03/19/22   ? ?  ?  ? ?  ? ? ? Plan - 02/24/22 0945   ? ? Clinical Impression Statement Pt reports continued wean from brace wth work and ADLS however pain can be 6/10 -10/10 with prolonged activity. She reports medial ankle feels most strain/pain. Continued with dynamic balance and stepping with good tolerance. She has met LGT#6 , #7. She has one more covered visit. Will continue dynamic balance and calf strength.   ? PT Treatment/Interventions ADLs/Self Care Home Management;Aquatic Therapy;Cryotherapy;Electrical Stimulation;Ultrasound;Moist Heat;Iontophoresis 3m27ml Dexamethasone;Gait training;Stair training;Functional mobility training;Therapeutic activities;Therapeutic exercise;Balance training;Neuromuscular re-education;Manual techniques;Patient/family education;Passive range of motion;Dry needling;Taping;Joint Manipulations   ? PT Next Visit Plan LTG, squat, stairs, PF strength for  completion of goals   ?  PT Home Exercise Plan [id number]   ? ?  ?  ? ?  ? ? ?Patient will benefit from skilled therapeutic intervention in order to improve the following deficits and impairments:  Abnormal gait, Difficulty walking, Decreased range of motion, Decreased activity tolerance, Obesity, Pain, Increased edema, Decreased strength, Decreased balance ? ?Visit Diagnosis: ?Decreased range of motion of right ankle ? ?Status post open reduction with internal fixation (ORIF) of fracture of ankle ? ?Muscle weakness (generalized) ? ?Difficulty in walking, not elsewhere classified ? ? ? ? ?Problem List ?Patient Active Problem List  ? Diagnosis Date Noted  ? Migraine without aura and without status migrainosus, not intractable 05/01/2013  ? Morbid obesity ([place]) 05/01/2013  ? ? ?Dorene Ar, PTA ?02/24/2022, 9:48 AM ? ?[facility] ?[facility] ?[address] ?[place], [place], [address] ?Phone: [phone]   Fax:  [phone] ? ?Name: Beverly Hawkins ?MRN: [id number] ?Date of Birth: August 21, 2001 ? ? ? ?

## 2022-03-03 ENCOUNTER — Ambulatory Visit: Payer: Medicaid Other

## 2022-03-03 NOTE — Therapy (Incomplete)
[place] [facility] ?[address] ?[place], [place], [address] ?Phone: [phone]   Fax:  [phone] ? ?Physical Therapy Treatment ? ?Patient Details  ?Name: Beverly Hawkins ?MRN: [id number] ?Date of Birth: [birth year] ?Referring Provider (PT): Willaim Sheng, MD ? ? ?Encounter Date: 03/03/2022 ? ? ? ? ?Past Medical History:  ?Diagnosis Date  ? Headache(784.0)   ? Obesity   ? Reflux gastritis   ? Walker as ambulation aid   ? and crutches  ? ? ?Past Surgical History:  ?Procedure Laterality Date  ? MANIPULATION ANKLE Right   ? with sedation  ? ORIF ANKLE FRACTURE Right 09/11/2021  ? Procedure: OPEN REDUCTION INTERNAL FIXATION (ORIF) ANKLE FRACTURE;  Surgeon: Willaim Sheng, MD;  Location: [facility];  Service: Orthopedics;  Laterality: Right;  ? ? ?There were no vitals filed for this visit. ? ? ? ? ? ? ?Therapeutic Exercise: ?-stepping over hurdles- step to then reciprocal leading with each foot ?-side steppng over hurrdles eacy way ?-SLS with opp  LE 3 taps on hurdle x 5  ?- SLS with light opp toe touch for ball toss and catching, rebounder toss ?- semit tandem on foam with rebounder toss ?-lateral shuffles 4 x 4 each way ?-hands on wall SLS- step/ball/change x 5 each  ?- slant board stretch ?-bilat heel raise ?- unable to complete singel heel raise on right  ? ? ? ?  ?Therapeutic Exercise: ?-SLS 10 sec, 10 sec, 12 sec, 14 sec, 15 sec  ?-Slant board 3x20 sec gastroc and soleus ?- bilat heel raises ?-bilat heel raise off 2 inch step x 20 ?-4inch right lateral step down x 15 - needs UE ?-forward step down-retro step up on stairs (4 inch) , single UE x 10 ?  ?  ?  ?  ?  ?Therapeutic Activity: ?  ?  ?-step up with opp knee drive 6inch without UE  for Single leg dynamic motion- needs intermittent UE touch.  ?  ? ? ? ? ? ? ? ? PT Short Term Goals - 01/28/22 1011   ? ?  ? PT SHORT TERM GOAL #1  ? Title Pt will beInd in an initail HEP   ? Baseline started on eval;   ? Status  Achieved   ? Target Date 11/14/21   ?  ? PT SHORT TERM GOAL #2  ? Title Pt will be able to ascend/descend 12 steps c crutches for improved community mobility   ? Baseline 11/03/21- can navigate with railing and one crutch - 4 steps; 11/26/21- able to navigate stairs with one HR, no AD, and ASO.   ? Status Achieved   ? Target Date 11/14/21   ? ?  ?  ? ?  ? ? ? ? PT Long Term Goals - 02/24/22 0946   ? ?  ? PT LONG TERM GOAL #1  ? Title Improve R ankle ROM to wiht 80% of the L for improve functional mobility. 12/10/21: Partially met: Met for Inv and ev, not met for DF and PF.01/27/22=PF R 38d; L 50d   ? Baseline lacks PF ROM (35 degrees)   ? Period Weeks   ? Status Partially Met   ? Target Date 03/19/22   ?  ? PT LONG TERM GOAL #2  ? Title Improve R ankle strength to 4+ for improved functional mobility. Revised goal- R SL PF = 5x c 1 hand assist.    12/10/21: partially met- met for DF and ev, not  met for PF and Inv; 01/06/22: met for all except PF. 01/27/22= pt is able to complete partial R SL PF   ? Baseline L SL PF= 10x, R SL PF= 1x- min lift   ? Period Weeks   ? Status Partially Met   ? Target Date 03/19/22   ?  ? PT LONG TERM GOAL #3  ? Title Pt will be able to walk 1046f s an AD and mild limp over the R LE for community ambulation and return to her job.  12/10/21: 4054f 01/20/22: can attend festivals without limitation-up to 4 hours on feet   ? Period Weeks   ? Status Achieved   ? Target Date 02/12/22   ?  ? PT LONG TERM GOAL #4  ? Title Will ascend /descend 24 stairs with hadnrails as needed for retrun to work. 12/10/21: ascends 8 steps c 2 hand A alternating; desends 8 steps c 2 hand assist, 1 step at a time; 01/06/22: can ascend with 1 HR, needs bilateral to descend alternating; 01/13/22: can ascend and descend with bilat handrails and good safety/control.01/20/22: can descend stairs in clinic with 1 HR - is practicing this more at work, limited by decreased R ankle PF ROM   ? Period Weeks   ? Status Achieved   ? Target Date  02/12/22   ?  ? PT LONG TERM GOAL #5  ? Title . Revised 12/10/21: R SLS time of 10sec for 5 repetitions for improved balance and function.   ? Baseline 15 sec best 02/17/22, >10 sec x 5   ? Period Weeks   ? Status Achieved   ?  ? PT LONG TERM GOAL #6  ? Title Pt will demostrate improved ability to desend steps with minimal 1 hand A for balance.   ? Baseline min touch and improved apprehension   ? Period Weeks   ? Status Achieved   ? Target Date 03/19/22   ?  ? PT LONG TERM GOAL #7  ? Title Pt will be able to compete light dynamic movements with R ankle to initate return to her dance/zumba exercise program   ? Baseline able to comple line dance steps, lateral shuffles, light plyo at wall today   ? Period Weeks   ? Status Achieved   ? Target Date 03/19/22   ? ?  ?  ? ?  ? ? ? ? ? ?Patient will benefit from skilled therapeutic intervention in order to improve the following deficits and impairments:    ? ?Visit Diagnosis: ?No diagnosis found. ? ? ? ? ?Problem List ?Patient Active Problem List  ? Diagnosis Date Noted  ? Migraine without aura and without status migrainosus, not intractable 05/01/2013  ? Morbid obesity ([place]05/27/2014  ? ? ?AlGar PontoPT ?03/03/2022, 5:50 AM ? ?[place] [facility] ?[address] [place]Phone: [phone] Fax:  [phone] ?Name: NeROBINETTE ESTERSMRN: [id number] of Birth: 03/2001-03-09 ? ? ?

## 2022-03-10 ENCOUNTER — Ambulatory Visit: Payer: Medicaid Other | Attending: Orthopedic Surgery

## 2022-03-10 DIAGNOSIS — Z9889 Other specified postprocedural states: Secondary | ICD-10-CM | POA: Insufficient documentation

## 2022-03-10 DIAGNOSIS — M6281 Muscle weakness (generalized): Secondary | ICD-10-CM | POA: Diagnosis present

## 2022-03-10 DIAGNOSIS — R262 Difficulty in walking, not elsewhere classified: Secondary | ICD-10-CM | POA: Diagnosis present

## 2022-03-10 DIAGNOSIS — M25671 Stiffness of right ankle, not elsewhere classified: Secondary | ICD-10-CM | POA: Diagnosis present

## 2022-03-10 DIAGNOSIS — Z8781 Personal history of (healed) traumatic fracture: Secondary | ICD-10-CM | POA: Diagnosis present

## 2022-03-10 NOTE — Therapy (Signed)
[place] ?[facility] ?[address] ?[place], [place], [address] ?Phone: [phone]   Fax:  [phone] ? ?Physical Therapy Treatment/Discharge ? ?Patient Details  ?Name: Beverly Hawkins ?MRN: [id number] ?Date of Birth: [birth year] ?Referring Provider (PT): Willaim Sheng, MD ? ? ?Encounter Date: 03/10/2022 ? ? PT End of Session - 03/10/22 0954   ? ? Visit Number 21   ? Number of Visits 21   ? Authorization Type [place] MEDICAID [employer] CARIT[employer] OF [place]-   ? Authorization Time Period 01/29/22-03/26/22   ? Authorization - Visit Number 4   ? Authorization - Number of Visits 4   ? PT Start Time [phone]   ? PT Stop Time 0881   ? PT Time Calculation (min) 45 min   ? Activity Tolerance Patient tolerated treatment well   ? Behavior During Therapy [facility] for tasks assessed/performed   ? ?  ?  ? ?  ? ? ?Past Medical History:  ?Diagnosis Date  ? Headache(784.0)   ? Obesity   ? Reflux gastritis   ? Walker as ambulation aid   ? and crutches  ? ? ?Past Surgical History:  ?Procedure Laterality Date  ? MANIPULATION ANKLE Right   ? with sedation  ? ORIF ANKLE FRACTURE Right 09/11/2021  ? Procedure: OPEN REDUCTION INTERNAL FIXATION (ORIF) ANKLE FRACTURE;  Surgeon: Willaim Sheng, MD;  Location: [facility];  Service: Orthopedics;  Laterality: Right;  ? ? ?There were no vitals filed for this visit. ? ? Subjective Assessment - 03/10/22 0949   ? ? Subjective pt reports she has been walking and using the stairs more at work. She notes she was up on her feet most of the day yesterday and her r ankle is sore today.   ? Pertinent History Obesity   ? Patient Stated Goals To be be able to walk again. To be able to return to work where she goes up and down steps.   ? Currently in Pain? Yes   ? Pain Score 5    Primarily she does not have R ankle pian  ? Pain Location Ankle   ? Pain Orientation Right   ? Pain Descriptors / Indicators Sore   ? Pain Type Chronic pain   ? ?  ?  ? ?  ? ? ? ? ? [facility] PT Assessment -  03/10/22 0001   ? ?  ? AROM  ? Right Ankle Dorsiflexion 10   ? Right Ankle Plantar Flexion 40   ? ?  ?  ? ?  ? ? ? ? ?[facility] Adult PT Treatment:                                                DATE: 03/10/22 ? ?Therapeutic Exercise: ?-stepping over hurdles forward and lateral- reciprocal leading with each foot ?-SLS with opp  LE 3 taps on hurdle x 5  ?-SLS R 5x 10 sec ?- Lateral steps with BluTB at feet 15', x4 ?- lateral shuffles 4 x 4 each way ?- unable to complete singel heel raise on right  ? ?Pt Education: ?Use of RICE and ankle brace as indicated ? ?  ? ? ? ? ? ? ? ? ? ? ? ? ? ? ? ? ? ? ? ? ? PT Short Term Goals - 01/28/22 1011   ? ?  ?  PT SHORT TERM GOAL #1  ? Title Pt will beInd in an initail HEP   ? Baseline started on eval;   ? Status Achieved   ? Target Date 11/14/21   ?  ? PT SHORT TERM GOAL #2  ? Title Pt will be able to ascend/descend 12 steps c crutches for improved community mobility   ? Baseline 11/03/21- can navigate with railing and one crutch - 4 steps; 11/26/21- able to navigate stairs with one HR, no AD, and ASO.   ? Status Achieved   ? Target Date 11/14/21   ? ?  ?  ? ?  ? ? ? ? PT Long Term Goals - 03/10/22 1605   ? ?  ? PT LONG TERM GOAL #1  ? Title Improve R ankle ROM to wiht 80% of the L for improve functional mobility. 12/10/21: Partially met: Met for Inv and ev, not met for DF and PF.01/27/22=PF R 38d; L 50d   ? Period Months   ? Status Partially Met   ? Target Date 03/19/22   ?  ? PT LONG TERM GOAL #2  ? Title Improve R ankle strength to 4+ for improved functional mobility. Revised goal- R SL PF = 5x c 1 hand assist.    12/10/21: partially met- met for DF and ev, not met for PF and Inv; 01/06/22: met for all except PF. 01/27/22= pt is able to complete partial R SL PF   ? Baseline L SL PF= 10x, R SL PF= 1x- min lift   ? Status Partially Met   ?  ? PT LONG TERM GOAL #3  ? Title Pt will be able to walk 104f s an AD and mild limp over the R LE for community ambulation and return to her job.  12/10/21:  4086f 01/20/22: can attend festivals without limitation-up to 4 hours on feet   ? Status Achieved   ? Target Date 02/12/22   ?  ? PT LONG TERM GOAL #4  ? Title Will ascend /descend 24 stairs with hadnrails as needed for retrun to work. 12/10/21: ascends 8 steps c 2 hand A alternating; desends 8 steps c 2 hand assist, 1 step at a time; 01/06/22: can ascend with 1 HR, needs bilateral to descend alternating; 01/13/22: can ascend and descend with bilat handrails and good safety/control.01/20/22: can descend stairs in clinic with 1 HR - is practicing this more at work, limited by decreased R ankle PF ROM. 03/10/22: Pt is asc/dsc 2 flights of steps c a recprocal pattern. No HR to asc, 1 HR to dsc.   ? Status Achieved   ? Target Date 03/10/22   ?  ? PT LONG TERM GOAL #5  ? Title . Revised 12/10/21: R SLS time of 10sec for 5 repetitions for improved balance and function. 03/10/22: 10 sec x5   ? Status Achieved   ? Target Date 03/10/22   ?  ? PT LONG TERM GOAL #6  ? Title Pt will demostrate improved ability to desend steps with minimal 1 hand A for balance.   ? Status Achieved   ? Target Date 03/10/22   ?  ? PT LONG TERM GOAL #7  ? Title Pt will be able to compete light dynamic movements with R ankle to initate return to her dance/zumba exercise program   ? Baseline able to comple line dance steps, lateral shuffles, light plyo at wall today   ? Status Achieved   ? Target Date 03/10/22   ? ?  ?  ? ?  ? ? ? ? ? ? ? ?  Plan - 03/10/22 1010   ? ? Clinical Impression Statement Pt is DCed from OPP[facility] today meeting the majority of the set goals. Pt still has some ROM limitations, but functionally she has returned to work where she is gradually  progressing the amount of time walking and the use of steps. Pt has returned to light dance workouts. Pt is Ind with a HEP to continue rehab and her progresion with function. Pt is in agreement with DC from PT at this time.   ? Personal Factors and Comorbidities Comorbidity 1   ? Stability/Clinical Decision  Making Stable/Uncomplicated   ? Clinical Decision Making Low   ? Rehab Potential Good   ? PT Frequency 1x / week   ? PT Duration 4 weeks   ? PT Treatment/Interventions ADLs/Self Care Home Management;Aquatic Therapy;Cryotherapy;Electrical Stimulation;Ultrasound;Moist Heat;Iontophoresis 20m/ml Dexamethasone;Gait training;Stair training;Functional mobility training;Therapeutic activities;Therapeutic exercise;Balance training;Neuromuscular re-education;Manual techniques;Patient/family education;Passive range of motion;Dry needling;Taping;Joint Manipulations   ? PT Home Exercise Plan [id number]   ? Consulted and Agree with Plan of Care Patient   ? ?  ?  ? ?  ? ? ?Patient will benefit from skilled therapeutic intervention in order to improve the following deficits and impairments:  Abnormal gait, Difficulty walking, Decreased range of motion, Decreased activity tolerance, Obesity, Pain, Increased edema, Decreased strength, Decreased balance ? ?Visit Diagnosis: ?Decreased range of motion of right ankle ? ?Muscle weakness (generalized) ? ?Difficulty in walking, not elsewhere classified ? ?Status post open reduction with internal fixation (ORIF) of fracture of ankle ? ? ? ? ?Problem List ?Patient Active Problem List  ? Diagnosis Date Noted  ? Migraine without aura and without status migrainosus, not intractable 05/01/2013  ? Morbid obesity ([place] 05/01/2013  ? ?PHYSICAL THERAPY DISCHARGE SUMMARY ? ?Visits from Start of Care: 21 ? ?Current functional level related to goals / functional outcomes: ?See impression and LTGs ?  ?Remaining deficits: ?See impression and LTGs ?  ?Education / Equipment: ?HEP. Pain and swelling/pain management  ? ?Patient agrees to discharge. Patient goals were met. Patient is being discharged due to being pleased with the current functional level. ? ? ?AGar Ponto PT ?03/10/2022, 4:19 PM ? ?[place] [facility] ?[address]?[place] [address]?Phone:  [phone]  Fax:  [phone]? ?Name: Beverly Hawkins?MRN: [id number]?Date of Birth: [birth year]? ? ? ?

## 2022-12-20 NOTE — Therapy (Signed)
OUTPATIENT PHYSICAL THERAPY THORACOLUMBAR EVALUATION   Patient Name: Beverly Hawkins MRN: [id number] DOB:2001/04/27,21 y.o., female Today's Date: 12/21/2022   END OF SESSION:  PT End of Session - 12/21/22 0930     Visit Number 1    Number of Visits 13    Date for PT Re-Evaluation 02/05/23    Authorization Type [employer]- no auth required 12 visits    PT Start Time 0930    PT Stop Time 0959    PT Time Calculation (min) 29 min    Activity Tolerance Patient tolerated treatment well    Behavior During Therapy WFL for tasks assessed/performed              Past Medical History:  Diagnosis Date   Headache(784.0)    Obesity    Reflux gastritis    Walker as ambulation aid    and crutches   Past Surgical History:  Procedure Laterality Date   MANIPULATION ANKLE Right    with sedation   ORIF ANKLE FRACTURE Right 09/11/2021   Procedure: OPEN REDUCTION INTERNAL FIXATION (ORIF) ANKLE FRACTURE;  Surgeon: Willaim Sheng, MD;  Location: [facility];  Service: Orthopedics;  Laterality: Right;   Patient Active Problem List   Diagnosis Date Noted   Migraine without aura and without status migrainosus, not intractable 05/01/2013   Morbid obesity ([place]) 05/01/2013    PCP: Trey Sailors, PA  REFERRING PROVIDER: Trey Sailors, PA   REFERRING DIAG: low back pain  Rationale for Evaluation and Treatment: Rehabilitation  THERAPY DIAG:  Other low back pain  Muscle weakness (generalized)  ONSET DATE: December 2023   SUBJECTIVE:                                                                                                                                                                                           SUBJECTIVE STATEMENT: Patient reports she started having back pain about a month ago of insidious onset. She noticed the pain after going to a concert as well as an increase in carrying/lifting activity as she has been keeping her nephew recently and has to pick  him up and carry him around. She has been treated with chiropractic care with some pain relief. She has been taking medication for her pain, but this hasn't helped. She denies any numbness or tingling, changes in bowel or bladder. It is a dull ache along her low back noticing it more when she bends over to pick something up or slouches. She denies any previous injury to her back.   PERTINENT HISTORY:  Rt ankle ORIF Obesity   PAIN:  Are you having  pain? Yes: NPRS scale: 3/10 Pain location: low back Pain description: dull ache Aggravating factors: bending, lifting, slouching Relieving factors: towel roll for sitting  PRECAUTIONS: None  WEIGHT BEARING RESTRICTIONS: No  FALLS:  Has patient fallen in last 6 months? No  LIVING ENVIRONMENT: Lives with: lives with their family Lives in: House/apartment Stairs: Yes: External: 5 steps; can reach both Has following equipment at home: None  OCCUPATION: front Metallurgist (desk job)   PLOF: Independent  PATIENT GOALS: "I want my back to feel better."    OBJECTIVE:   DIAGNOSTIC FINDINGS:  None   PATIENT SURVEYS:  Modified Oswestry 24% disability, 12/50   SCREENING FOR RED FLAGS: Bowel or bladder incontinence: No Spinal tumors: No Cauda equina syndrome: No Compression fracture: No Abdominal aneurysm: No  COGNITION: Overall cognitive status: Within functional limits for tasks assessed     SENSATION: Not tested  MUSCLE LENGTH: Hamstrings: WNL bilaterally   POSTURE slump sitting posture  PALPATION: TTP bilateral lumbar paraspinals   LUMBAR ROM:   AROM eval  Flexion WNL  Extension WNL  Right lateral flexion WNL*  Left lateral flexion WNL  Right rotation WNL  Left rotation WNL   (Blank rows = not tested) * pain  LOWER EXTREMITY MMT:     MMT  Right eval Left eval  Hip flexion 5 5  Hip extension 4- 4-  Hip abduction 4- 4   Hip adduction    Hip internal rotation    Hip external rotation    Knee flexion     Knee extension    Ankle dorsiflexion    Ankle plantarflexion    Ankle inversion    Core 2   (Blank rows = not tested)  LOWER EXTREMITY AROM:    AROM Right eval Left eval  Hip flexion    Hip extension    Hip abduction    Hip adduction    Hip internal rotation    Hip external rotation    Knee flexion    Knee extension    Ankle dorsiflexion    Ankle plantarflexion    Ankle inversion    Ankle eversion     (Blank rows = not tested)  SPECIAL TESTS:  (+) [place]   FUNCTIONAL TESTS:  Functional lifting: excessive lumbar flexion, no knee flexion.  Plank: unable   GAIT: Distance walked: 10 ft  Assistive device utilized: None Level of assistance: Complete Independence Comments: excessive frontal plane movement   [facility] Adult PT Treatment:                                                DATE: 12/21/22  Therapeutic Exercise: Demonstrated and issued initial HEP.   Therapeutic Activity: Education on assessment findings that will be addressed throughout duration of POC.    Self Care: Posture education     PATIENT EDUCATION:  Education details: see treatment Person educated: Patient Education method: Explanation, Demonstration, Tactile cues, Verbal cues, and Handouts Education comprehension: verbalized understanding, returned demonstration, verbal cues required, tactile cues required, and needs further education  HOME EXERCISE PROGRAM: Access Code: [email or web address] URL: [email or web address] Date: 12/21/2022 Prepared by: Letitia Libra  Exercises - Supine Bridge  - 1 x daily - 7 x weekly - 2 sets - 10 reps - Sidelying Hip Abduction  - 1 x daily - 7 x weekly - 2 sets - 10 reps -  Supine Active Straight Leg Raise  - 1 x daily - 7 x weekly - 2 sets - 10 reps - Seated Pelvic Tilt  - 1 x daily - 7 x weekly - 2 sets - 10 reps - Modified Thomas Stretch  - 1 x daily - 7 x weekly - 3 sets - 30 sec  hold  ASSESSMENT:  CLINICAL IMPRESSION: Patient is a 22 y.o. female who  was seen today for physical therapy evaluation and treatment for acute low back pain of insidious onset that began in December 2023. Upon assessment she is noted to have good lumbar AROM reporting mild pain with right lateral flexion, core/hip weakness, poor lifting mechanics, and postural abnormalities. She will benefit from skilled PT to address the above stated deficits in order to optimize her function.   OBJECTIVE IMPAIRMENTS: Abnormal gait, decreased activity tolerance, decreased endurance, decreased knowledge of condition, decreased strength, impaired flexibility, improper body mechanics, postural dysfunction, and pain.   ACTIVITY LIMITATIONS: carrying, lifting, bending, squatting, and caring for others  PARTICIPATION LIMITATIONS: meal prep, cleaning, laundry, shopping, community activity, and occupation  PERSONAL FACTORS: Age, Fitness, Profession, Time since onset of injury/illness/exacerbation, and 3+ comorbidities: see PMH above  are also affecting patient's functional outcome.   REHAB POTENTIAL: Good  CLINICAL DECISION MAKING: Stable/uncomplicated  EVALUATION COMPLEXITY: Low   GOALS: Goals reviewed with patient? Yes  SHORT TERM GOALS: Target date: 01/04/2023    Patient will be independent and compliant with initial HEP.   Baseline: see above Goal status: INITIAL  2.  Patient will demonstrate pain free lumbar AROM to improve ability to complete reaching and bending activity.  Baseline: see above  Goal status: INITIAL   LONG TERM GOALS: Target date: 02/01/2023    Patient will demonstrate at least 4+/5 bilateral hip abductor and extensor strength to improve stability about the chain with walking/standing activity.  Baseline: see above Goal status: INITIAL  2.  Patient will be able to lift 20 lbs with good form without reports of back pain.  Baseline: see above Goal status: INITIAL  3.  Patient will be able to maintain plank for at least 10 seconds, indicative of  improved lumbopelvic stability.  Baseline: see above Goal status: INITIAL  4.  Patient will be independent with advanced home program to progress/maintain current level of function.  Baseline: see above Goal status: INITIAL  PLAN:  PT FREQUENCY: 1-2x/week  PT DURATION: 6 weeks  PLANNED INTERVENTIONS: Therapeutic exercises, Therapeutic activity, Neuromuscular re-education, Balance training, Gait training, Patient/Family education, Self Care, Dry Needling, Cryotherapy, Moist heat, Manual therapy, and Re-evaluation.  PLAN FOR NEXT SESSION: review and progress HEP prn; core and hip strengthening. Lifting mechanics   Gwendolyn Grant, PT, DPT, ATC 12/21/22 1:37 PM

## 2022-12-21 ENCOUNTER — Ambulatory Visit: Payer: Medicaid Other | Attending: Physician Assistant

## 2022-12-21 ENCOUNTER — Other Ambulatory Visit: Payer: Self-pay

## 2022-12-21 DIAGNOSIS — M6281 Muscle weakness (generalized): Secondary | ICD-10-CM | POA: Diagnosis present

## 2022-12-21 DIAGNOSIS — M5459 Other low back pain: Secondary | ICD-10-CM | POA: Diagnosis not present

## 2022-12-28 ENCOUNTER — Encounter: Payer: Self-pay | Admitting: Physical Therapy

## 2022-12-28 ENCOUNTER — Ambulatory Visit: Payer: Medicaid Other | Admitting: Physical Therapy

## 2022-12-28 DIAGNOSIS — M5459 Other low back pain: Secondary | ICD-10-CM | POA: Diagnosis not present

## 2022-12-28 DIAGNOSIS — M6281 Muscle weakness (generalized): Secondary | ICD-10-CM

## 2022-12-28 NOTE — Therapy (Signed)
OUTPATIENT PHYSICAL THERAPY TREATMENT NOTE   Patient Name: Beverly Hawkins MRN: [id number] DOB:November 07, 2001, 22 y.o., female Today's Date: 12/28/2022  PCP: Norm Salt, PA   REFERRING PROVIDER: Norm Salt, PA   END OF SESSION:   PT End of Session - 12/28/22 0937     Visit Number 2    Number of Visits 13    Date for PT Re-Evaluation 02/05/23    Authorization Type [employer]- no auth required 12 visits    PT Start Time 0935    PT Stop Time 1015    PT Time Calculation (min) 40 min             Past Medical History:  Diagnosis Date   Headache(784.0)    Obesity    Reflux gastritis    Walker as ambulation aid    and crutches   Past Surgical History:  Procedure Laterality Date   MANIPULATION ANKLE Right    with sedation   ORIF ANKLE FRACTURE Right 09/11/2021   Procedure: OPEN REDUCTION INTERNAL FIXATION (ORIF) ANKLE FRACTURE;  Surgeon: Joen Laura, MD;  Location: [facility] OR;  Service: Orthopedics;  Laterality: Right;   Patient Active Problem List   Diagnosis Date Noted   Migraine without aura and without status migrainosus, not intractable 05/01/2013   Morbid obesity (HCC) 05/01/2013    REFERRING DIAG: low back pain  THERAPY DIAG:  Other low back pain  Muscle weakness (generalized)  Rationale for Evaluation and Treatment Rehabilitation  PERTINENT HISTORY:  Rt ankle ORIF Obesity   PRECAUTIONS: None  SUBJECTIVE:                                                                                                                                                                                      SUBJECTIVE STATEMENT:  I've been doing the exercises. I like the App for the exercises. I gave up on the bridge because it pulls in my calf.   PAIN:  Are you having pain? Yes: NPRS scale: 0/10 Pain location: low back Pain description: dull ache Aggravating factors: bending, lifting, slouching Relieving factors: towel roll for  sitting   OBJECTIVE: (objective measures completed at initial evaluation unless otherwise dated)   DIAGNOSTIC FINDINGS:  None    PATIENT SURVEYS:  Modified Oswestry 24% disability, 12/50    SCREENING FOR RED FLAGS: Bowel or bladder incontinence: No Spinal tumors: No Cauda equina syndrome: No Compression fracture: No Abdominal aneurysm: No   COGNITION: Overall cognitive status: Within functional limits for tasks assessed  SENSATION: Not tested   MUSCLE LENGTH: Hamstrings: WNL bilaterally    POSTURE slump sitting posture   PALPATION: TTP bilateral lumbar paraspinals    LUMBAR ROM:    AROM eval  Flexion WNL  Extension WNL  Right lateral flexion WNL*  Left lateral flexion WNL  Right rotation WNL  Left rotation WNL   (Blank rows = not tested) * pain   LOWER EXTREMITY MMT:      MMT  Right eval Left eval  Hip flexion 5 5  Hip extension 4- 4-  Hip abduction 4- 4   Hip adduction      Hip internal rotation      Hip external rotation      Knee flexion      Knee extension      Ankle dorsiflexion      Ankle plantarflexion      Ankle inversion      Core 2   (Blank rows = not tested)   LOWER EXTREMITY AROM:     AROM Right eval Left eval  Hip flexion      Hip extension      Hip abduction      Hip adduction      Hip internal rotation      Hip external rotation      Knee flexion      Knee extension      Ankle dorsiflexion      Ankle plantarflexion      Ankle inversion      Ankle eversion       (Blank rows = not tested)   SPECIAL TESTS:  (+) Ely    FUNCTIONAL TESTS:  Functional lifting: excessive lumbar flexion, no knee flexion.  Plank: unable    GAIT: Distance walked: 10 ft  Assistive device utilized: None Level of assistance: Complete Independence Comments: excessive frontal plane movement    [facility] Adult PT Treatment:                                                DATE: 12/28/22 Therapeutic Exercise: Review of HEP-  Cues for bridge -able to complete without pain in calf PPT with SLR 10 x 2 -fatigues in hip flexors  Isometric ab bracing with ball.  X 15-  Standing spring board bilateral pull down - 2 yellow springs level 10, x 10 reps Standing spring board bilateral row- 2 yellow springs at level 7- bent knees 2 x 10 reps Standing spring board single slastix at level 7 x 10 each for palloff press    [facility] Adult PT Treatment:                                                DATE: 12/21/22   Therapeutic Exercise: Demonstrated and issued initial HEP.    Therapeutic Activity: Education on assessment findings that will be addressed throughout duration of POC.      Self Care: Posture education        PATIENT EDUCATION:  Education details: see treatment Person educated: Patient Education method: Explanation, Demonstration, Tactile cues, Verbal cues, and Handouts Education comprehension: verbalized understanding, returned demonstration, verbal cues required, tactile cues required, and needs further education   HOME EXERCISE PROGRAM: Access Code:  [email or web address] URL: [email or web address] Date: 12/21/2022 Prepared by: Gwendolyn Grant   Exercises - Supine Bridge  - 1 x daily - 7 x weekly - 2 sets - 10 reps - Sidelying Hip Abduction  - 1 x daily - 7 x weekly - 2 sets - 10 reps - Supine Active Straight Leg Raise  - 1 x daily - 7 x weekly - 2 sets - 10 reps - Seated Pelvic Tilt  - 1 x daily - 7 x weekly - 2 sets - 10 reps - Modified Thomas Stretch  - 1 x daily - 7 x weekly - 3 sets - 30 sec  hold   ASSESSMENT:   CLINICAL IMPRESSION: Patient is a 22 y.o. female who was seen today for physical therapy treatment for acute low back pain of insidious onset that began in December 2023. She reports compliance with HEP.  Reviewed HEP- added PPT with SLR and this was difficult for her to complete prescribed reps. For bridge she reports pulling in right calf which was improved with cues for LE positioning.  Progressed supine core however standing core is more comfortable to due to body habitus. She did well using Pilates springboard. She will benefit from skilled PT to address the above stated deficits in order to optimize her function.    OBJECTIVE IMPAIRMENTS: Abnormal gait, decreased activity tolerance, decreased endurance, decreased knowledge of condition, decreased strength, impaired flexibility, improper body mechanics, postural dysfunction, and pain.    ACTIVITY LIMITATIONS: carrying, lifting, bending, squatting, and caring for others   PARTICIPATION LIMITATIONS: meal prep, cleaning, laundry, shopping, community activity, and occupation   PERSONAL FACTORS: Age, Fitness, Profession, Time since onset of injury/illness/exacerbation, and 3+ comorbidities: see PMH above  are also affecting patient's functional outcome.    REHAB POTENTIAL: Good   CLINICAL DECISION MAKING: Stable/uncomplicated   EVALUATION COMPLEXITY: Low     GOALS: Goals reviewed with patient? Yes   SHORT TERM GOALS: Target date: 01/04/2023       Patient will be independent and compliant with initial HEP.    Baseline: see above Goal status: INITIAL   2.  Patient will demonstrate pain free lumbar AROM to improve ability to complete reaching and bending activity.  Baseline: see above  Goal status: INITIAL     LONG TERM GOALS: Target date: 02/01/2023       Patient will demonstrate at least 4+/5 bilateral hip abductor and extensor strength to improve stability about the chain with walking/standing activity.  Baseline: see above Goal status: INITIAL   2.  Patient will be able to lift 20 lbs with good form without reports of back pain.  Baseline: see above Goal status: INITIAL   3.  Patient will be able to maintain plank for at least 10 seconds, indicative of improved lumbopelvic stability.  Baseline: see above Goal status: INITIAL   4.  Patient will be independent with advanced home program to  progress/maintain current level of function.  Baseline: see above Goal status: INITIAL   PLAN:   PT FREQUENCY: 1-2x/week   PT DURATION: 6 weeks   PLANNED INTERVENTIONS: Therapeutic exercises, Therapeutic activity, Neuromuscular re-education, Balance training, Gait training, Patient/Family education, Self Care, Dry Needling, Cryotherapy, Moist heat, Manual therapy, and Re-evaluation.   PLAN FOR NEXT SESSION: review and progress HEP prn; core and hip strengthening. Lifting mechanics , consider adding standing core to HEP   Hessie Diener, PTA 12/28/22 10:16 AM Phone: [phone] Fax: [phone]

## 2022-12-31 ENCOUNTER — Ambulatory Visit: Payer: Medicaid Other | Admitting: Physical Therapy

## 2022-12-31 ENCOUNTER — Encounter: Payer: Self-pay | Admitting: Physical Therapy

## 2022-12-31 DIAGNOSIS — M5459 Other low back pain: Secondary | ICD-10-CM | POA: Diagnosis not present

## 2022-12-31 DIAGNOSIS — M6281 Muscle weakness (generalized): Secondary | ICD-10-CM

## 2022-12-31 NOTE — Therapy (Signed)
OUTPATIENT PHYSICAL THERAPY TREATMENT NOTE   Patient Name: Beverly Hawkins MRN: [id number] DOB:10/18/2001, 22 y.o., female Today's Date: 12/31/2022  PCP: Norm Salt, PA   REFERRING PROVIDER: Norm Salt, PA   END OF SESSION:   PT End of Session - 12/31/22 0935     Visit Number 3    Number of Visits 13    Date for PT Re-Evaluation 02/05/23    Authorization Type [employer]- no auth required 12 visits    PT Start Time 0933    PT Stop Time 1015    PT Time Calculation (min) 42 min             Past Medical History:  Diagnosis Date   Headache(784.0)    Obesity    Reflux gastritis    Walker as ambulation aid    and crutches   Past Surgical History:  Procedure Laterality Date   MANIPULATION ANKLE Right    with sedation   ORIF ANKLE FRACTURE Right 09/11/2021   Procedure: OPEN REDUCTION INTERNAL FIXATION (ORIF) ANKLE FRACTURE;  Surgeon: Joen Laura, MD;  Location: [facility] OR;  Service: Orthopedics;  Laterality: Right;   Patient Active Problem List   Diagnosis Date Noted   Migraine without aura and without status migrainosus, not intractable 05/01/2013   Morbid obesity (HCC) 05/01/2013    REFERRING DIAG: low back pain  THERAPY DIAG:  Other low back pain  Muscle weakness (generalized)  Rationale for Evaluation and Treatment Rehabilitation  PERTINENT HISTORY:  Rt ankle ORIF Obesity   PRECAUTIONS: None  SUBJECTIVE:                                                                                                                                                                                      SUBJECTIVE STATEMENT:  I woke up with my back hurting a lot this morning. It might have been the way I slept. I was really tired after last time but my back felt good. I like the standing core exercises.    PAIN:  Are you having pain? Yes: NPRS scale: 5/10 Pain location: low back Pain description: dull ache Aggravating factors: bending, lifting,  slouching Relieving factors: towel roll for sitting   OBJECTIVE: (objective measures completed at initial evaluation unless otherwise dated)   DIAGNOSTIC FINDINGS:  None    PATIENT SURVEYS:  Modified Oswestry 24% disability, 12/50    SCREENING FOR RED FLAGS: Bowel or bladder incontinence: No Spinal tumors: No Cauda equina syndrome: No Compression fracture: No Abdominal aneurysm: No   COGNITION: Overall cognitive status: Within functional limits for tasks assessed  SENSATION: Not tested   MUSCLE LENGTH: Hamstrings: WNL bilaterally    POSTURE slump sitting posture   PALPATION: TTP bilateral lumbar paraspinals    LUMBAR ROM:    AROM eval  Flexion WNL  Extension WNL  Right lateral flexion WNL*  Left lateral flexion WNL  Right rotation WNL  Left rotation WNL   (Blank rows = not tested) * pain   LOWER EXTREMITY MMT:      MMT  Right eval Left eval  Hip flexion 5 5  Hip extension 4- 4-  Hip abduction 4- 4   Hip adduction      Hip internal rotation      Hip external rotation      Knee flexion      Knee extension      Ankle dorsiflexion      Ankle plantarflexion      Ankle inversion      Core 2   (Blank rows = not tested)   LOWER EXTREMITY AROM:     AROM Right eval Left eval  Hip flexion      Hip extension      Hip abduction      Hip adduction      Hip internal rotation      Hip external rotation      Knee flexion      Knee extension      Ankle dorsiflexion      Ankle plantarflexion      Ankle inversion      Ankle eversion       (Blank rows = not tested)   SPECIAL TESTS:  (+) Ely    FUNCTIONAL TESTS:  Functional lifting: excessive lumbar flexion, no knee flexion.  Plank: unable    GAIT: Distance walked: 10 ft  Assistive device utilized: None Level of assistance: Complete Independence Comments: excessive frontal plane movement     [facility] Adult PT Treatment:                                                DATE:  12/31/22 Therapeutic Exercise: Seated PPT  Standing spring board bilateral pull down - 2 yellow springs level 10, x 10 reps Standing spring board bilateral row- 2 yellow springs at level 7- bent knees 2 x 10 reps Standing spring board double  slastix at level 7 2 x 10 each for palloff press  Standing Spring board reverse Wood chops single slastix from level 3 10 x 2 each  Standing bird dogs -alt UE/LE march- cues for stow controlled movement  Overhead circles 7# dumbbell Cw and CCW 10 x 2 each for core stability Hip flexor stretch x 3 each Bridge 10 x 1- c/o right calf pain Updated HEP    [facility] Adult PT Treatment:                                                DATE: 12/28/22 Therapeutic Exercise: Review of HEP- Cues for bridge -able to complete without pain in calf PPT with SLR 10 x 2 -fatigues in hip flexors  Isometric ab bracing with ball.  X 15-  Standing spring board bilateral pull down - 2 yellow springs level 10, x 10 reps Standing spring board  bilateral row- 2 yellow springs at level 7- bent knees 2 x 10 reps Standing spring board single slastix at level 7 x 10 each for palloff press    [facility] Adult PT Treatment:                                                DATE: 12/21/22   Therapeutic Exercise: Demonstrated and issued initial HEP.    Therapeutic Activity: Education on assessment findings that will be addressed throughout duration of POC.      Self Care: Posture education        PATIENT EDUCATION:  Education details: see treatment Person educated: Patient Education method: Explanation, Demonstration, Tactile cues, Verbal cues, and Handouts Education comprehension: verbalized understanding, returned demonstration, verbal cues required, tactile cues required, and needs further education   HOME EXERCISE PROGRAM: Access Code: [email or web address] URL: [email or web address] Date: 12/21/2022 Prepared by: Gwendolyn Grant   Exercises - Supine Bridge  - 1 x daily - 7 x  weekly - 2 sets - 10 reps - Sidelying Hip Abduction  - 1 x daily - 7 x weekly - 2 sets - 10 reps - Supine Active Straight Leg Raise  - 1 x daily - 7 x weekly - 2 sets - 10 reps - Seated Pelvic Tilt  - 1 x daily - 7 x weekly - 2 sets - 10 reps - Modified Thomas Stretch  - 1 x daily - 7 x weekly - 3 sets - 30 sec  hold Added 12/31/22  - Standing Resistance Shoulder Extension  - 1 x daily - 7 x weekly - 2 sets - 10 reps - Squatting Shoulder Row with Anchored Resistance  - 1 x daily - 7 x weekly - 2 sets - 10 reps - Standing Anti-Rotation Press with Anchored Resistance  - 1 x daily - 7 x weekly - 2 sets - 10 reps  ASSESSMENT:   CLINICAL IMPRESSION: Patient is a 22 y.o. female who was seen today for physical therapy treatment for acute low back pain of insidious onset that began in December 2023. She reports compliance with HEP.  She reports increased back pain this morning possibly due to sleep positioning. She reports that she likes the standing core exercises from last visit. Progressed with these and updated her HEP to include them. She denied increased back pain during session. She will benefit from skilled PT to address the above stated deficits in order to optimize her function.    OBJECTIVE IMPAIRMENTS: Abnormal gait, decreased activity tolerance, decreased endurance, decreased knowledge of condition, decreased strength, impaired flexibility, improper body mechanics, postural dysfunction, and pain.    ACTIVITY LIMITATIONS: carrying, lifting, bending, squatting, and caring for others   PARTICIPATION LIMITATIONS: meal prep, cleaning, laundry, shopping, community activity, and occupation   PERSONAL FACTORS: Age, Fitness, Profession, Time since onset of injury/illness/exacerbation, and 3+ comorbidities: see PMH above  are also affecting patient's functional outcome.    REHAB POTENTIAL: Good   CLINICAL DECISION MAKING: Stable/uncomplicated   EVALUATION COMPLEXITY: Low     GOALS: Goals  reviewed with patient? Yes   SHORT TERM GOALS: Target date: 01/04/2023       Patient will be independent and compliant with initial HEP.    Baseline: see above Goal status: INITIAL   2.  Patient will demonstrate pain free lumbar AROM to improve ability to complete reaching  and bending activity.  Baseline: see above  Goal status: INITIAL     LONG TERM GOALS: Target date: 02/01/2023       Patient will demonstrate at least 4+/5 bilateral hip abductor and extensor strength to improve stability about the chain with walking/standing activity.  Baseline: see above Goal status: INITIAL   2.  Patient will be able to lift 20 lbs with good form without reports of back pain.  Baseline: see above Goal status: INITIAL   3.  Patient will be able to maintain plank for at least 10 seconds, indicative of improved lumbopelvic stability.  Baseline: see above Goal status: INITIAL   4.  Patient will be independent with advanced home program to progress/maintain current level of function.  Baseline: see above Goal status: INITIAL   PLAN:   PT FREQUENCY: 1-2x/week   PT DURATION: 6 weeks   PLANNED INTERVENTIONS: Therapeutic exercises, Therapeutic activity, Neuromuscular re-education, Balance training, Gait training, Patient/Family education, Self Care, Dry Needling, Cryotherapy, Moist heat, Manual therapy, and Re-evaluation.   PLAN FOR NEXT SESSION: review and progress HEP prn; core and hip strengthening. Lifting mechanics , check new HEP, progress standing core prn   Hessie Diener, PTA 12/31/22 11:41 AM Phone: [phone] Fax: [phone]

## 2023-01-05 ENCOUNTER — Ambulatory Visit: Payer: Medicaid Other

## 2023-01-05 DIAGNOSIS — M5459 Other low back pain: Secondary | ICD-10-CM | POA: Diagnosis not present

## 2023-01-05 DIAGNOSIS — M6281 Muscle weakness (generalized): Secondary | ICD-10-CM

## 2023-01-05 NOTE — Therapy (Signed)
OUTPATIENT PHYSICAL THERAPY TREATMENT NOTE   Patient Name: Beverly Hawkins MRN: [id number] DOB:Sep 24, 2001, 22 y.o., female Today's Date: 01/05/2023  PCP: Trey Sailors, PA   REFERRING PROVIDER: Trey Sailors, PA   END OF SESSION:   PT End of Session - 01/05/23 0936     Visit Number 4    Number of Visits 13    Date for PT Re-Evaluation 02/05/23    Authorization Type [employer]- no auth required 12 visits    PT Start Time 0935    PT Stop Time 1013    PT Time Calculation (min) 38 min    Activity Tolerance Patient tolerated treatment well    Behavior During Therapy WFL for tasks assessed/performed              Past Medical History:  Diagnosis Date   Headache(784.0)    Obesity    Reflux gastritis    Walker as ambulation aid    and crutches   Past Surgical History:  Procedure Laterality Date   MANIPULATION ANKLE Right    with sedation   ORIF ANKLE FRACTURE Right 09/11/2021   Procedure: OPEN REDUCTION INTERNAL FIXATION (ORIF) ANKLE FRACTURE;  Surgeon: Willaim Sheng, MD;  Location: [facility];  Service: Orthopedics;  Laterality: Right;   Patient Active Problem List   Diagnosis Date Noted   Migraine without aura and without status migrainosus, not intractable 05/01/2013   Morbid obesity ([place]) 05/01/2013    REFERRING DIAG: low back pain  THERAPY DIAG:  Other low back pain  Muscle weakness (generalized)  Rationale for Evaluation and Treatment Rehabilitation  PERTINENT HISTORY:  Rt ankle ORIF Obesity   PRECAUTIONS: None  SUBJECTIVE:                                                                                                                                                                                      SUBJECTIVE STATEMENT:  Patient reports the back is feeling ok right now without reports of pain. She has been more aware of her posture when sitting.    PAIN:  Are you having pain? No    OBJECTIVE: (objective measures completed at  initial evaluation unless otherwise dated)   DIAGNOSTIC FINDINGS:  None    PATIENT SURVEYS:  Modified Oswestry 24% disability, 12/50    SCREENING FOR RED FLAGS: Bowel or bladder incontinence: No Spinal tumors: No Cauda equina syndrome: No Compression fracture: No Abdominal aneurysm: No   COGNITION: Overall cognitive status: Within functional limits for tasks assessed  SENSATION: Not tested   MUSCLE LENGTH: Hamstrings: WNL bilaterally    POSTURE slump sitting posture   PALPATION: TTP bilateral lumbar paraspinals    LUMBAR ROM:    AROM eval 01/05/23  Flexion WNL WNL  Extension WNL WNL  Right lateral flexion WNL* WNL  Left lateral flexion WNL WNL  Right rotation WNL WNL  Left rotation WNL WNL   (Blank rows = not tested) * pain   LOWER EXTREMITY MMT:      MMT  Right eval Left eval  Hip flexion 5 5  Hip extension 4- 4-  Hip abduction 4- 4   Hip adduction      Hip internal rotation      Hip external rotation      Knee flexion      Knee extension      Ankle dorsiflexion      Ankle plantarflexion      Ankle inversion      Core 2   (Blank rows = not tested)   LOWER EXTREMITY AROM:     AROM Right eval Left eval  Hip flexion      Hip extension      Hip abduction      Hip adduction      Hip internal rotation      Hip external rotation      Knee flexion      Knee extension      Ankle dorsiflexion      Ankle plantarflexion      Ankle inversion      Ankle eversion       (Blank rows = not tested)   SPECIAL TESTS:  (+) Ely    FUNCTIONAL TESTS:  Functional lifting: excessive lumbar flexion, no knee flexion.  Plank: unable    GAIT: Distance walked: 10 ft  Assistive device utilized: None Level of assistance: Complete Independence Comments: excessive frontal plane movement   [facility] Adult PT Treatment:                                                DATE: 01/05/23 Therapeutic Exercise: LTR x 1 minute  Quadruped arm extension  2 x 10  Prone hip extension 2 x 10  Side plank 2 x 10 sec, 1 x 5 sec (Rt); 3 x 5 sec (Lt)  Sidelying hip circles 2 x 10  Modified mountain climber at counter 2 x 10  Standing resisted hip abduction green band 2 x 10  Standing resisted hip extension green band 2 x 10  Standing march 2 x 10  Updated HEP     [facility] Adult PT Treatment:                                                DATE: 12/31/22 Therapeutic Exercise: Seated PPT  Standing spring board bilateral pull down - 2 yellow springs level 10, x 10 reps Standing spring board bilateral row- 2 yellow springs at level 7- bent knees 2 x 10 reps Standing spring board double  slastix at level 7 2 x 10 each for palloff press  Standing Spring board reverse Wood chops single slastix from level 3 10 x 2 each  Standing bird dogs -alt UE/LE march- cues  for stow controlled movement  Overhead circles 7# dumbbell Cw and CCW 10 x 2 each for core stability Hip flexor stretch x 3 each Bridge 10 x 1- c/o right calf pain Updated HEP    [facility] Adult PT Treatment:                                                DATE: 12/28/22 Therapeutic Exercise: Review of HEP- Cues for bridge -able to complete without pain in calf PPT with SLR 10 x 2 -fatigues in hip flexors  Isometric ab bracing with ball.  X 15-  Standing spring board bilateral pull down - 2 yellow springs level 10, x 10 reps Standing spring board bilateral row- 2 yellow springs at level 7- bent knees 2 x 10 reps Standing spring board single slastix at level 7 x 10 each for palloff press       PATIENT EDUCATION:  Education details: see treatment Person educated: Patient Education method: Explanation, Demonstration, Tactile cues, Verbal cues, and Handouts Education comprehension: verbalized understanding, returned demonstration, verbal cues required, tactile cues required, and needs further education   HOME EXERCISE PROGRAM: Access Code: [email or web address] URL: [email or web address] Date:  12/21/2022 Prepared by: Gwendolyn Grant   Exercises - Supine Bridge  - 1 x daily - 7 x weekly - 2 sets - 10 reps - Sidelying Hip Abduction  - 1 x daily - 7 x weekly - 2 sets - 10 reps - Supine Active Straight Leg Raise  - 1 x daily - 7 x weekly - 2 sets - 10 reps - Seated Pelvic Tilt  - 1 x daily - 7 x weekly - 2 sets - 10 reps - Modified Thomas Stretch  - 1 x daily - 7 x weekly - 3 sets - 30 sec  hold Added 12/31/22  - Standing Resistance Shoulder Extension  - 1 x daily - 7 x weekly - 2 sets - 10 reps - Squatting Shoulder Row with Anchored Resistance  - 1 x daily - 7 x weekly - 2 sets - 10 reps - Standing Anti-Rotation Press with Anchored Resistance  - 1 x daily - 7 x weekly - 2 sets - 10 reps  ASSESSMENT:   CLINICAL IMPRESSION: Patient arrives without reports of back pain. She demonstrates full and pain free lumbar AROM having met this STG. Focused on progression of core and hip strengthening with good tolerance. Able to introduce quadruped strengthening with patient quickly fatiguing. HEP was updated to include further strengthening.    OBJECTIVE IMPAIRMENTS: Abnormal gait, decreased activity tolerance, decreased endurance, decreased knowledge of condition, decreased strength, impaired flexibility, improper body mechanics, postural dysfunction, and pain.    ACTIVITY LIMITATIONS: carrying, lifting, bending, squatting, and caring for others   PARTICIPATION LIMITATIONS: meal prep, cleaning, laundry, shopping, community activity, and occupation   PERSONAL FACTORS: Age, Fitness, Profession, Time since onset of injury/illness/exacerbation, and 3+ comorbidities: see PMH above  are also affecting patient's functional outcome.    REHAB POTENTIAL: Good   CLINICAL DECISION MAKING: Stable/uncomplicated   EVALUATION COMPLEXITY: Low     GOALS: Goals reviewed with patient? Yes   SHORT TERM GOALS: Target date: 01/04/2023       Patient will be independent and compliant with initial HEP.     Baseline: see above Goal status: met   2.  Patient will demonstrate pain free lumbar AROM  to improve ability to complete reaching and bending activity.  Baseline: see above  Goal status: met     LONG TERM GOALS: Target date: 02/01/2023       Patient will demonstrate at least 4+/5 bilateral hip abductor and extensor strength to improve stability about the chain with walking/standing activity.  Baseline: see above Goal status: INITIAL   2.  Patient will be able to lift 20 lbs with good form without reports of back pain.  Baseline: see above Goal status: INITIAL   3.  Patient will be able to maintain plank for at least 10 seconds, indicative of improved lumbopelvic stability.  Baseline: see above Goal status: INITIAL   4.  Patient will be independent with advanced home program to progress/maintain current level of function.  Baseline: see above Goal status: INITIAL   PLAN:   PT FREQUENCY: 1-2x/week   PT DURATION: 6 weeks   PLANNED INTERVENTIONS: Therapeutic exercises, Therapeutic activity, Neuromuscular re-education, Balance training, Gait training, Patient/Family education, Self Care, Dry Needling, Cryotherapy, Moist heat, Manual therapy, and Re-evaluation.   PLAN FOR NEXT SESSION: review and progress HEP prn; core and hip strengthening. Lifting mechanics     Letitia Libra, PT, DPT, ATC 01/05/23 10:13 AM

## 2023-01-07 ENCOUNTER — Ambulatory Visit: Payer: Medicaid Other | Attending: Physician Assistant

## 2023-01-07 DIAGNOSIS — M25671 Stiffness of right ankle, not elsewhere classified: Secondary | ICD-10-CM | POA: Diagnosis present

## 2023-01-07 DIAGNOSIS — M5459 Other low back pain: Secondary | ICD-10-CM

## 2023-01-07 DIAGNOSIS — M6281 Muscle weakness (generalized): Secondary | ICD-10-CM | POA: Insufficient documentation

## 2023-01-07 NOTE — Therapy (Signed)
OUTPATIENT PHYSICAL THERAPY TREATMENT NOTE   Patient Name: Beverly Hawkins MRN: [id number] DOB:[birth year], 22 y.o., female Today's Date: 01/07/2023  PCP: Norm Salt, PA   REFERRING PROVIDER: Norm Salt, PA   END OF SESSION:   PT End of Session - 01/07/23 0932     Visit Number 5    Number of Visits 13    Date for PT Re-Evaluation 02/05/23    Authorization Type [employer]- no auth required 12 visits    PT Start Time 0932    PT Stop Time 1011    PT Time Calculation (min) 39 min    Activity Tolerance Patient tolerated treatment well    Behavior During Therapy WFL for tasks assessed/performed               Past Medical History:  Diagnosis Date   Headache(784.0)    Obesity    Reflux gastritis    Walker as ambulation aid    and crutches   Past Surgical History:  Procedure Laterality Date   MANIPULATION ANKLE Right    with sedation   ORIF ANKLE FRACTURE Right 09/11/2021   Procedure: OPEN REDUCTION INTERNAL FIXATION (ORIF) ANKLE FRACTURE;  Surgeon: Joen Laura, MD;  Location: [facility];  Service: Orthopedics;  Laterality: Right;   Patient Active Problem List   Diagnosis Date Noted   Migraine without aura and without status migrainosus, not intractable 05/01/2013   Morbid obesity ([facility]) 05/01/2013    REFERRING DIAG: low back pain  THERAPY DIAG:  Other low back pain  Muscle weakness (generalized)  Rationale for Evaluation and Treatment Rehabilitation  PERTINENT HISTORY:  Rt ankle ORIF Obesity   PRECAUTIONS: None  SUBJECTIVE:                                                                                                                                                                                      SUBJECTIVE STATEMENT:  Patient reports her back is feeling ok right now without pain.    PAIN:  Are you having pain? No    OBJECTIVE: (objective measures completed at initial evaluation unless otherwise dated)   DIAGNOSTIC  FINDINGS:  None    PATIENT SURVEYS:  Modified Oswestry 24% disability, 12/50    SCREENING FOR RED FLAGS: Bowel or bladder incontinence: No Spinal tumors: No Cauda equina syndrome: No Compression fracture: No Abdominal aneurysm: No   COGNITION: Overall cognitive status: Within functional limits for tasks assessed                          SENSATION: Not tested   MUSCLE LENGTH: Hamstrings: WNL bilaterally  POSTURE slump sitting posture   PALPATION: TTP bilateral lumbar paraspinals    LUMBAR ROM:    AROM eval 01/05/23  Flexion WNL WNL  Extension WNL WNL  Right lateral flexion WNL* WNL  Left lateral flexion WNL WNL  Right rotation WNL WNL  Left rotation WNL WNL   (Blank rows = not tested) * pain   LOWER EXTREMITY MMT:      MMT  Right eval Left eval 01/07/23  Hip flexion 5 5   Hip extension 4- 4-   Hip abduction 4- 4  4/5 bilateral  Hip adduction       Hip internal rotation       Hip external rotation       Knee flexion       Knee extension       Ankle dorsiflexion       Ankle plantarflexion       Ankle inversion       Core 2    (Blank rows = not tested)   LOWER EXTREMITY AROM:     AROM Right eval Left eval  Hip flexion      Hip extension      Hip abduction      Hip adduction      Hip internal rotation      Hip external rotation      Knee flexion      Knee extension      Ankle dorsiflexion      Ankle plantarflexion      Ankle inversion      Ankle eversion       (Blank rows = not tested)   SPECIAL TESTS:  (+) E[place]    FUNCTIONAL TESTS:  Functional lifting: excessive lumbar flexion, no knee flexion.  Plank: unable    GAIT: Distance walked: 10 ft  Assistive device utilized: None Level of assistance: Complete Independence Comments: excessive frontal plane movement  [facility] Adult PT Treatment:                                                DATE: 01/07/23 Therapeutic Exercise: Quadruped leg extension 2 x 10  LTR x 1 minute Sit to stand with 15  lb kettlebell 2 x 10  Supine pelvic tilts x 10  Standing resisted lateral trunk flexion with 15 lbs kettlebell 2 x 10  Step ups knee driver x 10 each 8 inch step on cybex  Deadlift with 6 lb dumbbell 1 x 10  Pallof press 2 x 10 green band    [facility] Adult PT Treatment:                                                DATE: 01/05/23 Therapeutic Exercise: LTR x 1 minute  Quadruped arm extension 2 x 10  Prone hip extension 2 x 10  Side plank 2 x 10 sec, 1 x 5 sec (Rt); 3 x 5 sec (Lt)  Sidelying hip circles 2 x 10  Modified mountain climber at counter 2 x 10  Standing resisted hip abduction green band 2 x 10  Standing resisted hip extension green band 2 x 10  Standing march 2 x 10  Updated HEP     [facility] Adult  PT Treatment:                                                DATE: 12/31/22 Therapeutic Exercise: Seated PPT  Standing spring board bilateral pull down - 2 yellow springs level 10, x 10 reps Standing spring board bilateral row- 2 yellow springs at level 7- bent knees 2 x 10 reps Standing spring board double  slastix at level 7 2 x 10 each for palloff press  Standing Spring board reverse Wood chops single slastix from level 3 10 x 2 each  Standing bird dogs -alt UE/LE march- cues for stow controlled movement  Overhead circles 7# dumbbell Cw and CCW 10 x 2 each for core stability Hip flexor stretch x 3 each Bridge 10 x 1- c/o right calf pain Updated HEP      PATIENT EDUCATION:  Education details: HEP review  Person educated: Patient Education method: Explanation Education comprehension: verbalized understanding   HOME EXERCISE PROGRAM: Access Code: [email or web address] URL: [email or web address] Date: 12/21/2022 Prepared by: Gwendolyn Grant   Exercises - Supine Bridge  - 1 x daily - 7 x weekly - 2 sets - 10 reps - Sidelying Hip Abduction  - 1 x daily - 7 x weekly - 2 sets - 10 reps - Supine Active Straight Leg Raise  - 1 x daily - 7 x weekly - 2 sets - 10 reps - Seated  Pelvic Tilt  - 1 x daily - 7 x weekly - 2 sets - 10 reps - Modified Thomas Stretch  - 1 x daily - 7 x weekly - 3 sets - 30 sec  hold Added 12/31/22  - Standing Resistance Shoulder Extension  - 1 x daily - 7 x weekly - 2 sets - 10 reps - Squatting Shoulder Row with Anchored Resistance  - 1 x daily - 7 x weekly - 2 sets - 10 reps - Standing Anti-Rotation Press with Anchored Resistance  - 1 x daily - 7 x weekly - 2 sets - 10 reps  ASSESSMENT:   CLINICAL IMPRESSION: Patient arrives without reports of back pain. She demonstrates good form and control with sit to stand, but initially reported a pulling/stretch in her low back that was abolished after stretching. She reports more difficulty with isolated strengthening on the Lt compared to the Rt. She fatigued quickly with functional lifting activity, stating she could only complete 1 set of deadlift as her thighs felt really weak. She reported muscle fatigue at conclusion of session, but no back pain.    OBJECTIVE IMPAIRMENTS: Abnormal gait, decreased activity tolerance, decreased endurance, decreased knowledge of condition, decreased strength, impaired flexibility, improper body mechanics, postural dysfunction, and pain.    ACTIVITY LIMITATIONS: carrying, lifting, bending, squatting, and caring for others   PARTICIPATION LIMITATIONS: meal prep, cleaning, laundry, shopping, community activity, and occupation   PERSONAL FACTORS: Age, Fitness, Profession, Time since onset of injury/illness/exacerbation, and 3+ comorbidities: see PMH above  are also affecting patient's functional outcome.    REHAB POTENTIAL: Good   CLINICAL DECISION MAKING: Stable/uncomplicated   EVALUATION COMPLEXITY: Low     GOALS: Goals reviewed with patient? Yes   SHORT TERM GOALS: Target date: 01/04/2023       Patient will be independent and compliant with initial HEP.    Baseline: see above Goal status: met   2.  Patient will demonstrate  pain free lumbar AROM to  improve ability to complete reaching and bending activity.  Baseline: see above  Goal status: met     LONG TERM GOALS: Target date: 02/01/2023       Patient will demonstrate at least 4+/5 bilateral hip abductor and extensor strength to improve stability about the chain with walking/standing activity.  Baseline: see above Goal status: INITIAL   2.  Patient will be able to lift 20 lbs with good form without reports of back pain.  Baseline: see above Goal status: INITIAL   3.  Patient will be able to maintain plank for at least 10 seconds, indicative of improved lumbopelvic stability.  Baseline: see above Goal status: INITIAL   4.  Patient will be independent with advanced home program to progress/maintain current level of function.  Baseline: see above Goal status: INITIAL   PLAN:   PT FREQUENCY: 1-2x/week   PT DURATION: 6 weeks   PLANNED INTERVENTIONS: Therapeutic exercises, Therapeutic activity, Neuromuscular re-education, Balance training, Gait training, Patient/Family education, Self Care, Dry Needling, Cryotherapy, Moist heat, Manual therapy, and Re-evaluation.   PLAN FOR NEXT SESSION: review and progress HEP prn; core and hip strengthening. Lifting mechanics     Gwendolyn Grant, PT, DPT, ATC 01/07/23 10:12 AM

## 2023-01-12 ENCOUNTER — Ambulatory Visit: Payer: Medicaid Other

## 2023-01-13 NOTE — Therapy (Signed)
OUTPATIENT PHYSICAL THERAPY TREATMENT NOTE   Patient Name: Beverly Hawkins MRN: [id number] DOB:01-30-2001, 22 y.o., female Today's Date: 01/14/2023  PCP: Trey Sailors, PA   REFERRING PROVIDER: Trey Sailors, PA   END OF SESSION:   PT End of Session - 01/14/23 0930     Visit Number 6    Number of Visits 13    Date for PT Re-Evaluation 02/05/23    Authorization Type [employer]- no auth required 12 visits    PT Start Time 0930    PT Stop Time 1011    PT Time Calculation (min) 41 min    Activity Tolerance Patient tolerated treatment well    Behavior During Therapy WFL for tasks assessed/performed                Past Medical History:  Diagnosis Date   Headache(784.0)    Obesity    Reflux gastritis    Walker as ambulation aid    and crutches   Past Surgical History:  Procedure Laterality Date   MANIPULATION ANKLE Right    with sedation   ORIF ANKLE FRACTURE Right 09/11/2021   Procedure: OPEN REDUCTION INTERNAL FIXATION (ORIF) ANKLE FRACTURE;  Surgeon: Willaim Sheng, MD;  Location: [facility];  Service: Orthopedics;  Laterality: Right;   Patient Active Problem List   Diagnosis Date Noted   Migraine without aura and without status migrainosus, not intractable 05/01/2013   Morbid obesity ([place]) 05/01/2013    REFERRING DIAG: low back pain  THERAPY DIAG:  Other low back pain  Muscle weakness (generalized)  Rationale for Evaluation and Treatment Rehabilitation  PERTINENT HISTORY:  Rt ankle ORIF Obesity   PRECAUTIONS: None  SUBJECTIVE:                                                                                                                                                                                      SUBJECTIVE STATEMENT:  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     PAIN:  Are you having pain? Yes: NPRS scale: 3/10 Pain location: low back Pain description: ache Aggravating factors: prolonged  activity, end of work day Relieving factors: stretching, exercise    OBJECTIVE: (objective measures completed at initial evaluation unless otherwise dated)   DIAGNOSTIC FINDINGS:  None    PATIENT SURVEYS:  Modified Oswestry 24% disability, 12/50    SCREENING FOR RED FLAGS: Bowel or bladder incontinence: No Spinal tumors: No Cauda equina syndrome: No Compression fracture: No Abdominal aneurysm: No   COGNITION: Overall cognitive status: Within functional limits for tasks assessed  SENSATION: Not tested   MUSCLE LENGTH: Hamstrings: WNL bilaterally    POSTURE slump sitting posture   PALPATION: TTP bilateral lumbar paraspinals    LUMBAR ROM:    AROM eval 01/05/23  Flexion WNL WNL  Extension WNL WNL  Right lateral flexion WNL* WNL  Left lateral flexion WNL WNL  Right rotation WNL WNL  Left rotation WNL WNL   (Blank rows = not tested) * pain   LOWER EXTREMITY MMT:      MMT  Right eval Left eval 01/07/23  Hip flexion 5 5   Hip extension 4- 4-   Hip abduction 4- 4  4/5 bilateral  Hip adduction       Hip internal rotation       Hip external rotation       Knee flexion       Knee extension       Ankle dorsiflexion       Ankle plantarflexion       Ankle inversion       Core 2    (Blank rows = not tested)   LOWER EXTREMITY AROM:     AROM Right eval Left eval  Hip flexion      Hip extension      Hip abduction      Hip adduction      Hip internal rotation      Hip external rotation      Knee flexion      Knee extension      Ankle dorsiflexion      Ankle plantarflexion      Ankle inversion      Ankle eversion       (Blank rows = not tested)   SPECIAL TESTS:  (+) Ely    FUNCTIONAL TESTS:  Functional lifting: excessive lumbar flexion, no knee flexion.  Plank: unable   01/14/23: plank 10 seconds    GAIT: Distance walked: 10 ft  Assistive device utilized: None Level of assistance: Complete Independence Comments: excessive  frontal plane movement  [facility] Adult PT Treatment:                                                DATE: 01/14/23 Therapeutic Exercise: 3 way stability ball rollout x 2 minutes  Seated TA march 2 x 10  Counter lat stretch x 30 sec Wall walks 4 x 10 ft  Kettlebell swings 2 x 10; 10 lbs  Deadlift 2 x 10 5 lbs  Hip hinge with row 2 x 15; 5 lbs  Supine pelvic tilt with resisted shoulder flexion green band x 10  Updated HEP     [facility] Adult PT Treatment:                                                DATE: 01/07/23 Therapeutic Exercise: Quadruped leg extension 2 x 10  LTR x 1 minute Sit to stand with 15 lb kettlebell 2 x 10  Supine pelvic tilts x 10  Standing resisted lateral trunk flexion with 15 lbs kettlebell 2 x 10  Step ups knee driver x 10 each 8 inch step on cybex  Deadlift with 6 lb dumbbell 1 x 10  Pallof press 2 x 10  green band    [facility] Adult PT Treatment:                                                DATE: 01/05/23 Therapeutic Exercise: LTR x 1 minute  Quadruped arm extension 2 x 10  Prone hip extension 2 x 10  Side plank 2 x 10 sec, 1 x 5 sec (Rt); 3 x 5 sec (Lt)  Sidelying hip circles 2 x 10  Modified mountain climber at counter 2 x 10  Standing resisted hip abduction green band 2 x 10  Standing resisted hip extension green band 2 x 10  Standing march 2 x 10  Updated HEP        PATIENT EDUCATION:  Education details: HEP update  Person educated: Patient Education method: Explanation, demo, cues, handout Education comprehension: verbalized understanding, returned demo, cues    HOME EXERCISE PROGRAM: Access Code: [email or web address] URL: [email or web address] Date: 12/21/2022 Prepared by: Gwendolyn Grant   Exercises - Supine Bridge  - 1 x daily - 7 x weekly - 2 sets - 10 reps - Sidelying Hip Abduction  - 1 x daily - 7 x weekly - 2 sets - 10 reps - Supine Active Straight Leg Raise  - 1 x daily - 7 x weekly - 2 sets - 10 reps - Seated Pelvic Tilt  - 1 x daily - 7  x weekly - 2 sets - 10 reps - Modified Thomas Stretch  - 1 x daily - 7 x weekly - 3 sets - 30 sec  hold Added 12/31/22  - Standing Resistance Shoulder Extension  - 1 x daily - 7 x weekly - 2 sets - 10 reps - Squatting Shoulder Row with Anchored Resistance  - 1 x daily - 7 x weekly - 2 sets - 10 reps - Standing Anti-Rotation Press with Anchored Resistance  - 1 x daily - 7 x weekly - 2 sets - 10 reps  ASSESSMENT:   CLINICAL IMPRESSION: Patient arrives with mild low back pain. Continued emphasis on dynamic core stabilization in standing and hip strengthening with good tolerance. She is able to maintain plank on elbows for 10 seconds having met this LTG. Initial cues required to allow for hip flexion with squatting and dead lift with ability to correct once cued. Worked on increasing sets and reps to focus on endurance with patient quickly fatiguing. No change in back pain reported at conclusion.    OBJECTIVE IMPAIRMENTS: Abnormal gait, decreased activity tolerance, decreased endurance, decreased knowledge of condition, decreased strength, impaired flexibility, improper body mechanics, postural dysfunction, and pain.    ACTIVITY LIMITATIONS: carrying, lifting, bending, squatting, and caring for others   PARTICIPATION LIMITATIONS: meal prep, cleaning, laundry, shopping, community activity, and occupation   PERSONAL FACTORS: Age, Fitness, Profession, Time since onset of injury/illness/exacerbation, and 3+ comorbidities: see PMH above  are also affecting patient's functional outcome.    REHAB POTENTIAL: Good   CLINICAL DECISION MAKING: Stable/uncomplicated   EVALUATION COMPLEXITY: Low     GOALS: Goals reviewed with patient? Yes   SHORT TERM GOALS: Target date: 01/04/2023       Patient will be independent and compliant with initial HEP.    Baseline: see above Goal status: met   2.  Patient will demonstrate pain free lumbar AROM to improve ability to complete reaching and bending  activity.  Baseline:  see above  Goal status: met     LONG TERM GOALS: Target date: 02/01/2023       Patient will demonstrate at least 4+/5 bilateral hip abductor and extensor strength to improve stability about the chain with walking/standing activity.  Baseline: see above Goal status: INITIAL   2.  Patient will be able to lift 20 lbs with good form without reports of back pain.  Baseline: see above Goal status: INITIAL   3.  Patient will be able to maintain plank for at least 10 seconds, indicative of improved lumbopelvic stability.  Baseline: see above Goal status: met   4.  Patient will be independent with advanced home program to progress/maintain current level of function.  Baseline: see above Goal status: INITIAL   PLAN:   PT FREQUENCY: 1-2x/week   PT DURATION: 6 weeks   PLANNED INTERVENTIONS: Therapeutic exercises, Therapeutic activity, Neuromuscular re-education, Balance training, Gait training, Patient/Family education, Self Care, Dry Needling, Cryotherapy, Moist heat, Manual therapy, and Re-evaluation.   PLAN FOR NEXT SESSION: review and progress HEP prn; core and hip strengthening. Lifting mechanics     Gwendolyn Grant, PT, DPT, ATC 01/14/23 10:13 AM

## 2023-01-14 ENCOUNTER — Ambulatory Visit: Payer: Medicaid Other

## 2023-01-14 DIAGNOSIS — M6281 Muscle weakness (generalized): Secondary | ICD-10-CM

## 2023-01-14 DIAGNOSIS — M5459 Other low back pain: Secondary | ICD-10-CM | POA: Diagnosis not present

## 2023-01-18 ENCOUNTER — Ambulatory Visit: Payer: Medicaid Other | Admitting: Physical Therapy

## 2023-01-18 ENCOUNTER — Encounter: Payer: Self-pay | Admitting: Physical Therapy

## 2023-01-18 DIAGNOSIS — M5459 Other low back pain: Secondary | ICD-10-CM | POA: Diagnosis not present

## 2023-01-18 DIAGNOSIS — M6281 Muscle weakness (generalized): Secondary | ICD-10-CM

## 2023-01-18 DIAGNOSIS — M25671 Stiffness of right ankle, not elsewhere classified: Secondary | ICD-10-CM

## 2023-01-18 NOTE — Therapy (Signed)
OUTPATIENT PHYSICAL THERAPY TREATMENT NOTE   Patient Name: Beverly Hawkins MRN: [id number] DOB:16-Oct-2001, 22 y.o., female Today's Date: 01/18/2023  PCP: Trey Sailors, PA   REFERRING PROVIDER: Trey Sailors, PA   END OF SESSION:   PT End of Session - 01/18/23 1458     Visit Number 7    Number of Visits 13    Date for PT Re-Evaluation 02/05/23    Authorization Type [employer]- no auth required 12 visits    PT Start Time 0248    PT Stop Time 0326    PT Time Calculation (min) 38 min                Past Medical History:  Diagnosis Date   Headache(784.0)    Obesity    Reflux gastritis    Walker as ambulation aid    and crutches   Past Surgical History:  Procedure Laterality Date   MANIPULATION ANKLE Right    with sedation   ORIF ANKLE FRACTURE Right 09/11/2021   Procedure: OPEN REDUCTION INTERNAL FIXATION (ORIF) ANKLE FRACTURE;  Surgeon: Willaim Sheng, MD;  Location: [facility];  Service: Orthopedics;  Laterality: Right;   Patient Active Problem List   Diagnosis Date Noted   Migraine without aura and without status migrainosus, not intractable 05/01/2013   Morbid obesity ([employer]) 05/01/2013    REFERRING DIAG: low back pain  THERAPY DIAG:  Other low back pain  Muscle weakness (generalized)  Decreased range of motion of right ankle  Rationale for Evaluation and Treatment Rehabilitation  PERTINENT HISTORY:  Rt ankle ORIF Obesity   PRECAUTIONS: None  SUBJECTIVE:                                                                                                                                                                                      SUBJECTIVE STATEMENT:  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     PAIN:  Are you having pain? Yes: NPRS scale: 3/10 Pain location: low back Pain description: ache Aggravating factors: prolonged activity, end of work day Relieving factors: stretching,  exercise    OBJECTIVE: (objective measures completed at initial evaluation unless otherwise dated)   DIAGNOSTIC FINDINGS:  None    PATIENT SURVEYS:  Modified Oswestry 24% disability, 12/50    SCREENING FOR RED FLAGS: Bowel or bladder incontinence: No Spinal tumors: No Cauda equina syndrome: No Compression fracture: No Abdominal aneurysm: No   COGNITION: Overall cognitive status: Within functional limits for tasks assessed  SENSATION: Not tested   MUSCLE LENGTH: Hamstrings: WNL bilaterally    POSTURE slump sitting posture   PALPATION: TTP bilateral lumbar paraspinals    LUMBAR ROM:    AROM eval 01/05/23  Flexion WNL WNL  Extension WNL WNL  Right lateral flexion WNL* WNL  Left lateral flexion WNL WNL  Right rotation WNL WNL  Left rotation WNL WNL   (Blank rows = not tested) * pain   LOWER EXTREMITY MMT:      MMT  Right eval Left eval 01/07/23  Hip flexion 5 5   Hip extension 4- 4-   Hip abduction 4- 4  4/5 bilateral  Hip adduction       Hip internal rotation       Hip external rotation       Knee flexion       Knee extension       Ankle dorsiflexion       Ankle plantarflexion       Ankle inversion       Core 2    (Blank rows = not tested)   LOWER EXTREMITY AROM:     AROM Right eval Left eval  Hip flexion      Hip extension      Hip abduction      Hip adduction      Hip internal rotation      Hip external rotation      Knee flexion      Knee extension      Ankle dorsiflexion      Ankle plantarflexion      Ankle inversion      Ankle eversion       (Blank rows = not tested)   SPECIAL TESTS:  (+) Ely    FUNCTIONAL TESTS:  Functional lifting: excessive lumbar flexion, no knee flexion.  Plank: unable   01/14/23: plank 10 seconds    GAIT: Distance walked: 10 ft  Assistive device utilized: None Level of assistance: Complete Independence Comments: excessive frontal plane movement  [facility] Adult PT Treatment:                                                 DATE: 01/18/23 Therapeutic Exercise: 2 way stability ball Holding 7# OH with marching  Spring board pull down Double green band palloff press x 20 each  25# KB lifts 10 x 2 Green band side stepping down and back at mat table. Monster walks 25 feet x 2  Plank 25 sec from knees  x 2     [facility] Adult PT Treatment:                                                DATE: 01/14/23 Therapeutic Exercise: 3 way stability ball rollout x 2 minutes  Seated TA march 2 x 10  Counter lat stretch x 30 sec Wall walks 4 x 10 ft  Kettlebell swings 2 x 10; 10 lbs  Deadlift 2 x 10 5 lbs  Hip hinge with row 2 x 15; 5 lbs  Supine pelvic tilt with resisted shoulder flexion green band x 10  Updated HEP     [facility] Adult PT Treatment:  DATE: 01/07/23 Therapeutic Exercise: Quadruped leg extension 2 x 10  LTR x 1 minute Sit to stand with 15 lb kettlebell 2 x 10  Supine pelvic tilts x 10  Standing resisted lateral trunk flexion with 15 lbs kettlebell 2 x 10  Step ups knee driver x 10 each 8 inch step on cybex  Deadlift with 6 lb dumbbell 1 x 10  Pallof press 2 x 10 green band    [facility] Adult PT Treatment:                                                DATE: 01/05/23 Therapeutic Exercise: LTR x 1 minute  Quadruped arm extension 2 x 10  Prone hip extension 2 x 10  Side plank 2 x 10 sec, 1 x 5 sec (Rt); 3 x 5 sec (Lt)  Sidelying hip circles 2 x 10  Modified mountain climber at counter 2 x 10  Standing resisted hip abduction green band 2 x 10  Standing resisted hip extension green band 2 x 10  Standing march 2 x 10  Updated HEP        PATIENT EDUCATION:  Education details: HEP update  Person educated: Patient Education method: Explanation, demo, cues, handout Education comprehension: verbalized understanding, returned demo, cues    HOME EXERCISE PROGRAM: Access Code: [email or web address] URL:  [email or web address] Date: 12/21/2022 Prepared by: Gwendolyn Grant   Exercises - Supine Bridge  - 1 x daily - 7 x weekly - 2 sets - 10 reps - Sidelying Hip Abduction  - 1 x daily - 7 x weekly - 2 sets - 10 reps - Supine Active Straight Leg Raise  - 1 x daily - 7 x weekly - 2 sets - 10 reps - Seated Pelvic Tilt  - 1 x daily - 7 x weekly - 2 sets - 10 reps - Modified Thomas Stretch  - 1 x daily - 7 x weekly - 3 sets - 30 sec  hold Added 12/31/22  - Standing Resistance Shoulder Extension  - 1 x daily - 7 x weekly - 2 sets - 10 reps - Squatting Shoulder Row with Anchored Resistance  - 1 x daily - 7 x weekly - 2 sets - 10 reps - Standing Anti-Rotation Press with Anchored Resistance  - 1 x daily - 7 x weekly - 2 sets - 10 reps  ASSESSMENT:   CLINICAL IMPRESSION: Patient arrives without LBP but reports she has back pain at end of work days. She was very sore in her legs after lifting at last visit. She is not working today. She has started using Heat packs on her back at home with good results. Able to progress lifting to 25# today with good form and no back pain. She felt thigh soreness. LTG # 2. Progressed with standing hip abduction strengthening with fatigue reported.    OBJECTIVE IMPAIRMENTS: Abnormal gait, decreased activity tolerance, decreased endurance, decreased knowledge of condition, decreased strength, impaired flexibility, improper body mechanics, postural dysfunction, and pain.    ACTIVITY LIMITATIONS: carrying, lifting, bending, squatting, and caring for others   PARTICIPATION LIMITATIONS: meal prep, cleaning, laundry, shopping, community activity, and occupation   PERSONAL FACTORS: Age, Fitness, Profession, Time since onset of injury/illness/exacerbation, and 3+ comorbidities: see PMH above  are also affecting patient's functional outcome.    REHAB POTENTIAL: Good   CLINICAL DECISION MAKING:  Stable/uncomplicated   EVALUATION COMPLEXITY: Low     GOALS: Goals  reviewed with patient? Yes   SHORT TERM GOALS: Target date: 01/04/2023       Patient will be independent and compliant with initial HEP.    Baseline: see above Goal status: met   2.  Patient will demonstrate pain free lumbar AROM to improve ability to complete reaching and bending activity.  Baseline: see above  Goal status: met     LONG TERM GOALS: Target date: 02/01/2023       Patient will demonstrate at least 4+/5 bilateral hip abductor and extensor strength to improve stability about the chain with walking/standing activity.  Baseline: see above Goal status: INITIAL   2.  Patient will be able to lift 20 lbs with good form without reports of back pain. Baseline: see above Goal status: MET   3.  Patient will be able to maintain plank for at least 10 seconds, indicative of improved lumbopelvic stability.  Baseline: see above Goal status: met   4.  Patient will be independent with advanced home program to progress/maintain current level of function.  Baseline: see above Goal status: INITIAL   PLAN:   PT FREQUENCY: 1-2x/week   PT DURATION: 6 weeks   PLANNED INTERVENTIONS: Therapeutic exercises, Therapeutic activity, Neuromuscular re-education, Balance training, Gait training, Patient/Family education, Self Care, Dry Needling, Cryotherapy, Moist heat, Manual therapy, and Re-evaluation.   PLAN FOR NEXT SESSION: review and progress HEP prn; core and hip strengthening.    Hessie Diener, PTA 01/18/23 3:11 PM Phone: [phone] Fax: [phone]

## 2023-01-26 ENCOUNTER — Ambulatory Visit: Payer: Medicaid Other

## 2023-01-26 DIAGNOSIS — M5459 Other low back pain: Secondary | ICD-10-CM

## 2023-01-26 DIAGNOSIS — M6281 Muscle weakness (generalized): Secondary | ICD-10-CM

## 2023-01-26 NOTE — Therapy (Signed)
OUTPATIENT PHYSICAL THERAPY TREATMENT NOTE   Patient Name: Beverly Hawkins MRN: [id number] DOB:2001/01/05, 22 y.o., female Today's Date: 01/26/2023  PCP: Trey Sailors, PA   REFERRING PROVIDER: Trey Sailors, PA   END OF SESSION:   PT End of Session - 01/26/23 1227     Visit Number 8    Number of Visits 13    Date for PT Re-Evaluation 02/05/23    Authorization Type [employer]- no auth required 12 visits    PT Start Time 1232    PT Stop Time 1315    PT Time Calculation (min) 43 min    Activity Tolerance Patient tolerated treatment well    Behavior During Therapy WFL for tasks assessed/performed                 Past Medical History:  Diagnosis Date   Headache(784.0)    Obesity    Reflux gastritis    Walker as ambulation aid    and crutches   Past Surgical History:  Procedure Laterality Date   MANIPULATION ANKLE Right    with sedation   ORIF ANKLE FRACTURE Right 09/11/2021   Procedure: OPEN REDUCTION INTERNAL FIXATION (ORIF) ANKLE FRACTURE;  Surgeon: Willaim Sheng, MD;  Location: [facility];  Service: Orthopedics;  Laterality: Right;   Patient Active Problem List   Diagnosis Date Noted   Migraine without aura and without status migrainosus, not intractable 05/01/2013   Morbid obesity ([place]) 05/01/2013    REFERRING DIAG: low back pain  THERAPY DIAG:  Other low back pain  Muscle weakness (generalized)  Rationale for Evaluation and Treatment Rehabilitation  PERTINENT HISTORY:  Rt ankle ORIF Obesity   PRECAUTIONS: None  SUBJECTIVE:                                                                                                                                                                                      SUBJECTIVE STATEMENT:  "It's actually been feeling better." She had to work a few 12 hour shifts since last session and her back didn't hurt as bad. She reports her back hurts a little more today because this is the first day she has  been able to rest since being busy all week.     PAIN:  Are you having pain? Yes: NPRS scale: 5/10 Pain location: low back Pain description: throb Aggravating factors: prolonged activity, end of work day Relieving factors: stretching, exercise    OBJECTIVE: (objective measures completed at initial evaluation unless otherwise dated)   DIAGNOSTIC FINDINGS:  None    PATIENT SURVEYS:  Modified Oswestry 24% disability, 12/50    SCREENING FOR RED FLAGS: Bowel or  bladder incontinence: No Spinal tumors: No Cauda equina syndrome: No Compression fracture: No Abdominal aneurysm: No   COGNITION: Overall cognitive status: Within functional limits for tasks assessed                          SENSATION: Not tested   MUSCLE LENGTH: Hamstrings: WNL bilaterally    POSTURE slump sitting posture   PALPATION: TTP bilateral lumbar paraspinals    LUMBAR ROM:    AROM eval 01/05/23  Flexion WNL WNL  Extension WNL WNL  Right lateral flexion WNL* WNL  Left lateral flexion WNL WNL  Right rotation WNL WNL  Left rotation WNL WNL   (Blank rows = not tested) * pain   LOWER EXTREMITY MMT:      MMT  Right eval Left eval 01/07/23 01/26/23  Hip flexion 5 5    Hip extension 4- 4-  Lt: 4-; Rt: 4+   Hip abduction 4- 4  4/5 bilateral   Hip adduction        Hip internal rotation        Hip external rotation        Knee flexion        Knee extension        Ankle dorsiflexion        Ankle plantarflexion        Ankle inversion        Core 2     (Blank rows = not tested)   LOWER EXTREMITY AROM:     AROM Right eval Left eval  Hip flexion      Hip extension      Hip abduction      Hip adduction      Hip internal rotation      Hip external rotation      Knee flexion      Knee extension      Ankle dorsiflexion      Ankle plantarflexion      Ankle inversion      Ankle eversion       (Blank rows = not tested)   SPECIAL TESTS:  (+) E[place]    FUNCTIONAL TESTS:  Functional lifting:  excessive lumbar flexion, no knee flexion.  Plank: unable   01/14/23: plank 10 seconds    GAIT: Distance walked: 10 ft  Assistive device utilized: None Level of assistance: Complete Independence Comments: excessive frontal plane movement  [facility] Adult PT Treatment:                                                DATE: 01/26/23 Therapeutic Exercise: Prone opposite arm/leg lift 2 x 10  Standing resisted mountain climber 2 x 15; red band Step up knee driver with overhead reach 5lb kettle bell; 1 x 10 each  Resisted lateral bend 2 x 15; 10 lb kettle bell  90/90 isometric hold 4 x 10 sec Lunge with rotation attempted d/c due to knee pain Tall kneel with overhead press x 15; 4 lbs  Updated HEP  Therapeutic activity:  Farmer's carry x 185 ft; 10 lb kettle bell in each hand Sled pushing 2  x50 ft @ 50 lbs     [facility] Adult PT Treatment:  DATE: 01/18/23 Therapeutic Exercise: 2 way stability ball Holding 7# OH with marching  Spring board pull down Double green band palloff press x 20 each  25# KB lifts 10 x 2 Green band side stepping down and back at mat table. Monster walks 25 feet x 2  Plank 25 sec from knees  x 2     [facility] Adult PT Treatment:                                                DATE: 01/14/23 Therapeutic Exercise: 3 way stability ball rollout x 2 minutes  Seated TA march 2 x 10  Counter lat stretch x 30 sec Wall walks 4 x 10 ft  Kettlebell swings 2 x 10; 10 lbs  Deadlift 2 x 10 5 lbs  Hip hinge with row 2 x 15; 5 lbs  Supine pelvic tilt with resisted shoulder flexion green band x 10  Updated HEP     [facility] Adult PT Treatment:                                                DATE: 01/07/23 Therapeutic Exercise: Quadruped leg extension 2 x 10  LTR x 1 minute Sit to stand with 15 lb kettlebell 2 x 10  Supine pelvic tilts x 10  Standing resisted lateral trunk flexion with 15 lbs kettlebell 2 x 10  Step ups knee driver x 10 each 8  inch step on cybex  Deadlift with 6 lb dumbbell 1 x 10  Pallof press 2 x 10 green band       PATIENT EDUCATION:  Education details: HEP update  Person educated: Patient Education method: Explanation, demo, cues, handout Education comprehension: verbalized understanding, returned demo, cues    HOME EXERCISE PROGRAM: Access Code: [email or web address] URL: [email or web address] Date: 12/21/2022 Prepared by: Gwendolyn Grant   Exercises - Supine Bridge  - 1 x daily - 7 x weekly - 2 sets - 10 reps - Sidelying Hip Abduction  - 1 x daily - 7 x weekly - 2 sets - 10 reps - Supine Active Straight Leg Raise  - 1 x daily - 7 x weekly - 2 sets - 10 reps - Seated Pelvic Tilt  - 1 x daily - 7 x weekly - 2 sets - 10 reps - Modified Thomas Stretch  - 1 x daily - 7 x weekly - 3 sets - 30 sec  hold Added 12/31/22  - Standing Resistance Shoulder Extension  - 1 x daily - 7 x weekly - 2 sets - 10 reps - Squatting Shoulder Row with Anchored Resistance  - 1 x daily - 7 x weekly - 2 sets - 10 reps - Standing Anti-Rotation Press with Anchored Resistance  - 1 x daily - 7 x weekly - 2 sets - 10 reps  ASSESSMENT:   CLINICAL IMPRESSION: Patient arrives with moderate back pain today, but reports an overall improvement in her back pain as she has not experienced as intense pain after her workdays this week. Continued progression of dynamic core stabilization in standing as well as functional carrying and pushing tasks. No increase in pain with functional activity with patient demonstrating good form/posture with these activities. She is demonstrating an  overall improvement in standing endurance requiring less frequent rest breaks today compared to previous sessions. HEP was updated to include further strengthening.    OBJECTIVE IMPAIRMENTS: Abnormal gait, decreased activity tolerance, decreased endurance, decreased knowledge of condition, decreased strength, impaired flexibility, improper body mechanics, postural  dysfunction, and pain.    ACTIVITY LIMITATIONS: carrying, lifting, bending, squatting, and caring for others   PARTICIPATION LIMITATIONS: meal prep, cleaning, laundry, shopping, community activity, and occupation   PERSONAL FACTORS: Age, Fitness, Profession, Time since onset of injury/illness/exacerbation, and 3+ comorbidities: see PMH above  are also affecting patient's functional outcome.    REHAB POTENTIAL: Good   CLINICAL DECISION MAKING: Stable/uncomplicated   EVALUATION COMPLEXITY: Low     GOALS: Goals reviewed with patient? Yes   SHORT TERM GOALS: Target date: 01/04/2023       Patient will be independent and compliant with initial HEP.    Baseline: see above Goal status: met   2.  Patient will demonstrate pain free lumbar AROM to improve ability to complete reaching and bending activity.  Baseline: see above  Goal status: met     LONG TERM GOALS: Target date: 02/01/2023       Patient will demonstrate at least 4+/5 bilateral hip abductor and extensor strength to improve stability about the chain with walking/standing activity.  Baseline: see above Goal status: INITIAL   2.  Patient will be able to lift 20 lbs with good form without reports of back pain. Baseline: see above Goal status: MET   3.  Patient will be able to maintain plank for at least 10 seconds, indicative of improved lumbopelvic stability.  Baseline: see above Goal status: met   4.  Patient will be independent with advanced home program to progress/maintain current level of function.  Baseline: see above Goal status: INITIAL   PLAN:   PT FREQUENCY: 1-2x/week   PT DURATION: 6 weeks   PLANNED INTERVENTIONS: Therapeutic exercises, Therapeutic activity, Neuromuscular re-education, Balance training, Gait training, Patient/Family education, Self Care, Dry Needling, Cryotherapy, Moist heat, Manual therapy, and Re-evaluation.   PLAN FOR NEXT SESSION: review and progress HEP prn; core and hip  strengthening.  Gwendolyn Grant, PT, DPT, ATC 01/26/23 1:23 PM

## 2023-01-28 ENCOUNTER — Ambulatory Visit: Payer: Medicaid Other

## 2023-01-28 DIAGNOSIS — M5459 Other low back pain: Secondary | ICD-10-CM | POA: Diagnosis not present

## 2023-01-28 DIAGNOSIS — M6281 Muscle weakness (generalized): Secondary | ICD-10-CM

## 2023-01-28 NOTE — Patient Instructions (Signed)

## 2023-01-28 NOTE — Therapy (Signed)
OUTPATIENT PHYSICAL THERAPY TREATMENT NOTE   Patient Name: Beverly Hawkins MRN: [id number] DOB:09-04-2001, 22 y.o., female Today's Date: 01/28/2023  PCP: Trey Sailors, PA   REFERRING PROVIDER: Trey Sailors, PA   END OF SESSION:   PT End of Session - 01/28/23 1102     Visit Number 9    Number of Visits 13    Date for PT Re-Evaluation 02/05/23    Authorization Type [employer]- no auth required 12 visits    PT Start Time 1102    PT Stop Time 1141    PT Time Calculation (min) 39 min    Activity Tolerance Patient tolerated treatment well    Behavior During Therapy WFL for tasks assessed/performed                 Past Medical History:  Diagnosis Date   Headache(784.0)    Obesity    Reflux gastritis    Walker as ambulation aid    and crutches   Past Surgical History:  Procedure Laterality Date   MANIPULATION ANKLE Right    with sedation   ORIF ANKLE FRACTURE Right 09/11/2021   Procedure: OPEN REDUCTION INTERNAL FIXATION (ORIF) ANKLE FRACTURE;  Surgeon: Willaim Sheng, MD;  Location: [facility];  Service: Orthopedics;  Laterality: Right;   Patient Active Problem List   Diagnosis Date Noted   Migraine without aura and without status migrainosus, not intractable 05/01/2013   Morbid obesity ([place]) 05/01/2013    REFERRING DIAG: low back pain  THERAPY DIAG:  Other low back pain  Muscle weakness (generalized)  Rationale for Evaluation and Treatment Rehabilitation  PERTINENT HISTORY:  Rt ankle ORIF Obesity   PRECAUTIONS: None  SUBJECTIVE:                                                                                                                                                                                      SUBJECTIVE STATEMENT:  "I'm ok. It's like a 2."    PAIN:  Are you having pain? Yes: NPRS scale: 2/10 Pain location: low back Pain description: ache Aggravating factors: prolonged activity, end of work day Relieving factors:  stretching, exercise    OBJECTIVE: (objective measures completed at initial evaluation unless otherwise dated)   DIAGNOSTIC FINDINGS:  None    PATIENT SURVEYS:  Modified Oswestry 24% disability, 12/50    SCREENING FOR RED FLAGS: Bowel or bladder incontinence: No Spinal tumors: No Cauda equina syndrome: No Compression fracture: No Abdominal aneurysm: No   COGNITION: Overall cognitive status: Within functional limits for tasks assessed  SENSATION: Not tested   MUSCLE LENGTH: Hamstrings: WNL bilaterally    POSTURE slump sitting posture   PALPATION: TTP bilateral lumbar paraspinals    LUMBAR ROM:    AROM eval 01/05/23  Flexion WNL WNL  Extension WNL WNL  Right lateral flexion WNL* WNL  Left lateral flexion WNL WNL  Right rotation WNL WNL  Left rotation WNL WNL   (Blank rows = not tested) * pain   LOWER EXTREMITY MMT:      MMT  Right eval Left eval 01/07/23 01/26/23  Hip flexion 5 5    Hip extension 4- 4-  Lt: 4-; Rt: 4+   Hip abduction 4- 4  4/5 bilateral   Hip adduction        Hip internal rotation        Hip external rotation        Knee flexion        Knee extension        Ankle dorsiflexion        Ankle plantarflexion        Ankle inversion        Core 2     (Blank rows = not tested)   LOWER EXTREMITY AROM:     AROM Right eval Left eval  Hip flexion      Hip extension      Hip abduction      Hip adduction      Hip internal rotation      Hip external rotation      Knee flexion      Knee extension      Ankle dorsiflexion      Ankle plantarflexion      Ankle inversion      Ankle eversion       (Blank rows = not tested)   SPECIAL TESTS:  (+) E[place]    FUNCTIONAL TESTS:  Functional lifting: excessive lumbar flexion, no knee flexion.  Plank: unable   01/14/23: plank 10 seconds    GAIT: Distance walked: 10 ft  Assistive device utilized: None Level of assistance: Complete Independence Comments: excessive frontal  plane movement  [facility] Adult PT Treatment:                                                DATE: 01/28/23 Therapeutic Exercise: Stability ball rollout x 2 minutes  Sidelying leg taps 2 x 10  Squats with 10 lb kettlebell 2 x 10  Deadlift with 10 lb kettlebell 2 x 10  Pallof press green band 2 x 10  Cat/cow x 10  Lateral band walks green band at shins 4 x 10 ft   Self Care: Education on posture and lifting/bending mechanics with handout provided.    [facility] Adult PT Treatment:                                                DATE: 01/26/23 Therapeutic Exercise: Prone opposite arm/leg lift 2 x 10  Standing resisted mountain climber 2 x 15; red band Step up knee driver with overhead reach 5lb kettle bell; 1 x 10 each  Resisted lateral bend 2 x 15; 10 lb kettle bell  90/90 isometric hold 4 x 10 sec Lunge with  rotation attempted d/c due to knee pain Tall kneel with overhead press x 15; 4 lbs  Updated HEP  Therapeutic activity:  Farmer's carry x 185 ft; 10 lb kettle bell in each hand Sled pushing 2  x50 ft @ 50 lbs     [facility] Adult PT Treatment:                                                DATE: 01/18/23 Therapeutic Exercise: 2 way stability ball Holding 7# OH with marching  Spring board pull down Double green band palloff press x 20 each  25# KB lifts 10 x 2 Green band side stepping down and back at mat table. Monster walks 25 feet x 2  Plank 25 sec from knees  x 2     [facility] Adult PT Treatment:                                                DATE: 01/14/23 Therapeutic Exercise: 3 way stability ball rollout x 2 minutes  Seated TA march 2 x 10  Counter lat stretch x 30 sec Wall walks 4 x 10 ft  Kettlebell swings 2 x 10; 10 lbs  Deadlift 2 x 10 5 lbs  Hip hinge with row 2 x 15; 5 lbs  Supine pelvic tilt with resisted shoulder flexion green band x 10  Updated HEP     PATIENT EDUCATION:  Education details:see impression Person educated: Patient Education method: Explanation, demo,  handout Education comprehension: verbalized understanding   HOME EXERCISE PROGRAM: Access Code: [email or web address] URL: [email or web address] Date: 12/21/2022 Prepared by: Gwendolyn Grant   Exercises - Supine Bridge  - 1 x daily - 7 x weekly - 2 sets - 10 reps - Sidelying Hip Abduction  - 1 x daily - 7 x weekly - 2 sets - 10 reps - Supine Active Straight Leg Raise  - 1 x daily - 7 x weekly - 2 sets - 10 reps - Seated Pelvic Tilt  - 1 x daily - 7 x weekly - 2 sets - 10 reps - Modified Thomas Stretch  - 1 x daily - 7 x weekly - 3 sets - 30 sec  hold Added 12/31/22  - Standing Resistance Shoulder Extension  - 1 x daily - 7 x weekly - 2 sets - 10 reps - Squatting Shoulder Row with Anchored Resistance  - 1 x daily - 7 x weekly - 2 sets - 10 reps - Standing Anti-Rotation Press with Anchored Resistance  - 1 x daily - 7 x weekly - 2 sets - 10 reps  ASSESSMENT:   CLINICAL IMPRESSION: Patient arrives with mild low back pain today. She reported an increase in back pain towards end of first set of deadlift, but when corrected to engage her glutes vs utilizing her back extensors to rise she reported no increase in pain during second set. Overall good tolerance to progression of standing strengthening with occasional reports of back pain. Handout was provided on proper lifting/bending mechanics and posture.    OBJECTIVE IMPAIRMENTS: Abnormal gait, decreased activity tolerance, decreased endurance, decreased knowledge of condition, decreased strength, impaired flexibility, improper body mechanics, postural dysfunction, and pain.    ACTIVITY LIMITATIONS: carrying, lifting, bending,  squatting, and caring for others   PARTICIPATION LIMITATIONS: meal prep, cleaning, laundry, shopping, community activity, and occupation   PERSONAL FACTORS: Age, Fitness, Profession, Time since onset of injury/illness/exacerbation, and 3+ comorbidities: see PMH above  are also affecting patient's functional outcome.     REHAB POTENTIAL: Good   CLINICAL DECISION MAKING: Stable/uncomplicated   EVALUATION COMPLEXITY: Low     GOALS: Goals reviewed with patient? Yes   SHORT TERM GOALS: Target date: 01/04/2023       Patient will be independent and compliant with initial HEP.    Baseline: see above Goal status: met   2.  Patient will demonstrate pain free lumbar AROM to improve ability to complete reaching and bending activity.  Baseline: see above  Goal status: met     LONG TERM GOALS: Target date: 02/01/2023       Patient will demonstrate at least 4+/5 bilateral hip abductor and extensor strength to improve stability about the chain with walking/standing activity.  Baseline: see above Goal status: INITIAL   2.  Patient will be able to lift 20 lbs with good form without reports of back pain. Baseline: see above Goal status: MET   3.  Patient will be able to maintain plank for at least 10 seconds, indicative of improved lumbopelvic stability.  Baseline: see above Goal status: met   4.  Patient will be independent with advanced home program to progress/maintain current level of function.  Baseline: see above Goal status: INITIAL   PLAN:   PT FREQUENCY: 1-2x/week   PT DURATION: 6 weeks   PLANNED INTERVENTIONS: Therapeutic exercises, Therapeutic activity, Neuromuscular re-education, Balance training, Gait training, Patient/Family education, Self Care, Dry Needling, Cryotherapy, Moist heat, Manual therapy, and Re-evaluation.   PLAN FOR NEXT SESSION: review and progress HEP prn; core and hip strengthening.  Gwendolyn Grant, PT, DPT, ATC 01/28/23 11:42 AM

## 2023-02-02 ENCOUNTER — Ambulatory Visit: Payer: Medicaid Other | Admitting: Physical Therapy

## 2023-02-04 ENCOUNTER — Encounter: Payer: Self-pay | Admitting: Physical Therapy

## 2023-02-04 ENCOUNTER — Ambulatory Visit: Payer: Medicaid Other | Attending: Physician Assistant | Admitting: Physical Therapy

## 2023-02-04 ENCOUNTER — Ambulatory Visit: Payer: Medicaid Other | Admitting: Physical Therapy

## 2023-02-04 DIAGNOSIS — M6281 Muscle weakness (generalized): Secondary | ICD-10-CM

## 2023-02-04 DIAGNOSIS — M25671 Stiffness of right ankle, not elsewhere classified: Secondary | ICD-10-CM

## 2023-02-04 DIAGNOSIS — M5459 Other low back pain: Secondary | ICD-10-CM | POA: Diagnosis present

## 2023-02-04 NOTE — Therapy (Addendum)
OUTPATIENT PHYSICAL THERAPY TREATMENT NOTE PHYSICAL THERAPY DISCHARGE SUMMARY  Visits from Start of Care: 10  Current functional level related to goals / functional outcomes: See goals below   Remaining deficits: Intermittent low back pain    Education / Equipment: HEP  Patient agrees to discharge. Patient goals were met. Patient is being discharged due to meeting the stated rehab goals.   Patient Name: Beverly Hawkins MRN: [id number] DOB:2001-08-03, 22 y.o., female Today's Date: 02/04/2023  PCP: Trey Sailors, PA   REFERRING PROVIDER: Trey Sailors, PA   END OF SESSION:   PT End of Session - 02/04/23 1331     Visit Number 10    Number of Visits 13    Date for PT Re-Evaluation 02/05/23    Authorization Type [employer]- no auth required 12 visits    PT Start Time 1330    PT Stop Time 1353    PT Time Calculation (min) 23 min                 Past Medical History:  Diagnosis Date   Headache(784.0)    Obesity    Reflux gastritis    Walker as ambulation aid    and crutches   Past Surgical History:  Procedure Laterality Date   MANIPULATION ANKLE Right    with sedation   ORIF ANKLE FRACTURE Right 09/11/2021   Procedure: OPEN REDUCTION INTERNAL FIXATION (ORIF) ANKLE FRACTURE;  Surgeon: Willaim Sheng, MD;  Location: [facility];  Service: Orthopedics;  Laterality: Right;   Patient Active Problem List   Diagnosis Date Noted   Migraine without aura and without status migrainosus, not intractable 05/01/2013   Morbid obesity ([facility]) 05/01/2013    REFERRING DIAG: low back pain  THERAPY DIAG:  Other low back pain  Muscle weakness (generalized)  Decreased range of motion of right ankle  Rationale for Evaluation and Treatment Rehabilitation  PERTINENT HISTORY:  Rt ankle ORIF Obesity   PRECAUTIONS: None  SUBJECTIVE:                                                                                                                                                                                       SUBJECTIVE STATEMENT:  "My back is a little sore. But otherwise I am okay."     PAIN:  Are you having pain? Yes: NPRS scale: 4/10 Pain location: low back Pain description: ache Aggravating factors: prolonged activity, end of work day Relieving factors: stretching, exercise    OBJECTIVE: (objective measures completed at initial evaluation unless otherwise dated)   DIAGNOSTIC FINDINGS:  None    PATIENT SURVEYS:  Modified Oswestry 24% disability, 12/50  SCREENING FOR RED FLAGS: Bowel or bladder incontinence: No Spinal tumors: No Cauda equina syndrome: No Compression fracture: No Abdominal aneurysm: No   COGNITION: Overall cognitive status: Within functional limits for tasks assessed                          SENSATION: Not tested   MUSCLE LENGTH: Hamstrings: WNL bilaterally    POSTURE slump sitting posture   PALPATION: TTP bilateral lumbar paraspinals    LUMBAR ROM:    AROM eval 01/05/23  Flexion WNL WNL  Extension WNL WNL  Right lateral flexion WNL* WNL  Left lateral flexion WNL WNL  Right rotation WNL WNL  Left rotation WNL WNL   (Blank rows = not tested) * pain   LOWER EXTREMITY MMT:      MMT  Right eval Left eval 01/07/23 01/26/23 02/04/23  Hip flexion 5 5     Hip extension 4- 4-  Lt: 4-; Rt: 4+  4+/5 bilateral   Hip abduction 4- 4  4/5 bilateral  4+/5 bilateral  Hip adduction         Hip internal rotation         Hip external rotation         Knee flexion         Knee extension         Ankle dorsiflexion         Ankle plantarflexion         Ankle inversion         Core 2      (Blank rows = not tested)   LOWER EXTREMITY AROM:     AROM Right eval Left eval  Hip flexion      Hip extension      Hip abduction      Hip adduction      Hip internal rotation      Hip external rotation      Knee flexion      Knee extension      Ankle dorsiflexion      Ankle plantarflexion      Ankle  inversion      Ankle eversion       (Blank rows = not tested)   SPECIAL TESTS:  (+) [place]    FUNCTIONAL TESTS:  Functional lifting: excessive lumbar flexion, no knee flexion.  Plank: unable   01/14/23: plank 10 seconds    GAIT: Distance walked: 10 ft  Assistive device utilized: None Level of assistance: Complete Independence Comments: excessive frontal plane movement  [facility] Adult PT Treatment:                                                DATE: 02/04/23 Therapeutic Exercise: Review of HEP MMT   [facility] Adult PT Treatment:                                                DATE: 01/28/23 Therapeutic Exercise: Stability ball rollout x 2 minutes  Sidelying leg taps 2 x 10  Squats with 10 lb kettlebell 2 x 10  Deadlift with 10 lb kettlebell 2 x 10  Pallof press green band 2 x 10  Cat/cow x  10  Lateral band walks green band at shins 4 x 10 ft   Self Care: Education on posture and lifting/bending mechanics with handout provided.    [facility] Adult PT Treatment:                                                DATE: 01/26/23 Therapeutic Exercise: Prone opposite arm/leg lift 2 x 10  Standing resisted mountain climber 2 x 15; red band Step up knee driver with overhead reach 5lb kettle bell; 1 x 10 each  Resisted lateral bend 2 x 15; 10 lb kettle bell  90/90 isometric hold 4 x 10 sec Lunge with rotation attempted d/c due to knee pain Tall kneel with overhead press x 15; 4 lbs  Updated HEP  Therapeutic activity:  Farmer's carry x 185 ft; 10 lb kettle bell in each hand Sled pushing 2  x50 ft @ 50 lbs     [facility] Adult PT Treatment:                                                DATE: 01/18/23 Therapeutic Exercise: 2 way stability ball Holding 7# OH with marching  Spring board pull down Double green band palloff press x 20 each  25# KB lifts 10 x 2 Green band side stepping down and back at mat table. Monster walks 25 feet x 2  Plank 25 sec from knees  x 2      PATIENT EDUCATION:   Education details:see impression Person educated: Patient Education method: Explanation, demo, handout Education comprehension: verbalized understanding   HOME EXERCISE PROGRAM: Access Code: [email or web address] URL: [email or web address] Date: 12/21/2022 Prepared by: Gwendolyn Grant   Exercises - Supine Bridge  - 1 x daily - 7 x weekly - 2 sets - 10 reps - Sidelying Hip Abduction  - 1 x daily - 7 x weekly - 2 sets - 10 reps - Supine Active Straight Leg Raise  - 1 x daily - 7 x weekly - 2 sets - 10 reps - Seated Pelvic Tilt  - 1 x daily - 7 x weekly - 2 sets - 10 reps - Modified Thomas Stretch  - 1 x daily - 7 x weekly - 3 sets - 30 sec  hold  - Standing Resistance Shoulder Extension  - 1 x daily - 7 x weekly - 2 sets - 10 reps - Squatting Shoulder Row with Anchored Resistance  - 1 x daily - 7 x weekly - 2 sets - 10 reps - Standing Anti-Rotation Press with Anchored Resistance  - 1 x daily - 7 x weekly - 2 sets - 10 reps - Side Plank on Knees  - 1 x daily - 7 x weekly - 3 sets - 10 sec  hold - Standing Hip Abduction with Resistance at Ankles and Counter Support  - 1 x daily - 7 x weekly - 2 sets - 10 reps - Standing Hip Extension with Resistance at Ankles and Counter Support  - 1 x daily - 7 x weekly - 2 sets - 10 reps - Seated March  - 1 x daily - 7 x weekly - 2 sets - 10 reps - Standing 'L' Stretch at [employer]  - 1  x daily - 7 x weekly - 3 sets - 30 sec  hold - Standing Mountain Climbers at [employer]  - 1 x daily - 7 x weekly - 2 sets - 15 reps - Prone Alternating Arm and Leg Lifts  - 1 x daily - 7 x weekly - 2 sets - 10 reps - Supine 90/90 Abdominal Bracing  - 1 x daily - 7 x weekly - 5 sets - 10 sec hold  ASSESSMENT:   CLINICAL IMPRESSION: Patient arrives with mild low back pain today. She reports significant improvement since starting PT and feels that she has learned how to manage her LBP. She has an advanced HEP that she demonstrates independence with. She is pleased with current  LOF and agreeable to discharge to HEP. She has met all LTGs.     OBJECTIVE IMPAIRMENTS: Abnormal gait, decreased activity tolerance, decreased endurance, decreased knowledge of condition, decreased strength, impaired flexibility, improper body mechanics, postural dysfunction, and pain.    ACTIVITY LIMITATIONS: carrying, lifting, bending, squatting, and caring for others   PARTICIPATION LIMITATIONS: meal prep, cleaning, laundry, shopping, community activity, and occupation   PERSONAL FACTORS: Age, Fitness, Profession, Time since onset of injury/illness/exacerbation, and 3+ comorbidities: see PMH above  are also affecting patient's functional outcome.    REHAB POTENTIAL: Good   CLINICAL DECISION MAKING: Stable/uncomplicated   EVALUATION COMPLEXITY: Low     GOALS: Goals reviewed with patient? Yes   SHORT TERM GOALS: Target date: 01/04/2023       Patient will be independent and compliant with initial HEP.    Baseline: see above Goal status: met   2.  Patient will demonstrate pain free lumbar AROM to improve ability to complete reaching and bending activity.  Baseline: see above  Goal status: met     LONG TERM GOALS: Target date: 02/01/2023       Patient will demonstrate at least 4+/5 bilateral hip abductor and extensor strength to improve stability about the chain with walking/standing activity.  Baseline: see above 02/04/23:  Goal status: MET   2.  Patient will be able to lift 20 lbs with good form without reports of back pain. Baseline: see above Goal status: MET   3.  Patient will be able to maintain plank for at least 10 seconds, indicative of improved lumbopelvic stability.  Baseline: see above Goal status: met   4.  Patient will be independent with advanced home program to progress/maintain current level of function.  Baseline: see above Goal status: MET   PLAN:   PT FREQUENCY: n/a   PT DURATION: n/a   PLANNED INTERVENTIONS: Therapeutic exercises,  Therapeutic activity, Neuromuscular re-education, Balance training, Gait training, Patient/Family education, Self Care, Dry Needling, Cryotherapy, Moist heat, Manual therapy, and Re-evaluation.   PLAN FOR NEXT SESSION: N/A discharge with HEP.   Hessie Diener, PTA 02/04/23 1:54 PM Phone: [phone] Fax: [phone]   Gwendolyn Grant, PT, DPT, ATC 02/21/23 9:37 AM

## 2023-02-07 IMAGING — DX DG ANKLE COMPLETE 3+V*R*
3 series · 3 of 3 positions shown · non-contrast
Comparison: 07/05/2011

CLINICAL DATA: Fall.  Pain.

EXAM:
RIGHT ANKLE - COMPLETE 3+ VIEW

[ankle ap]
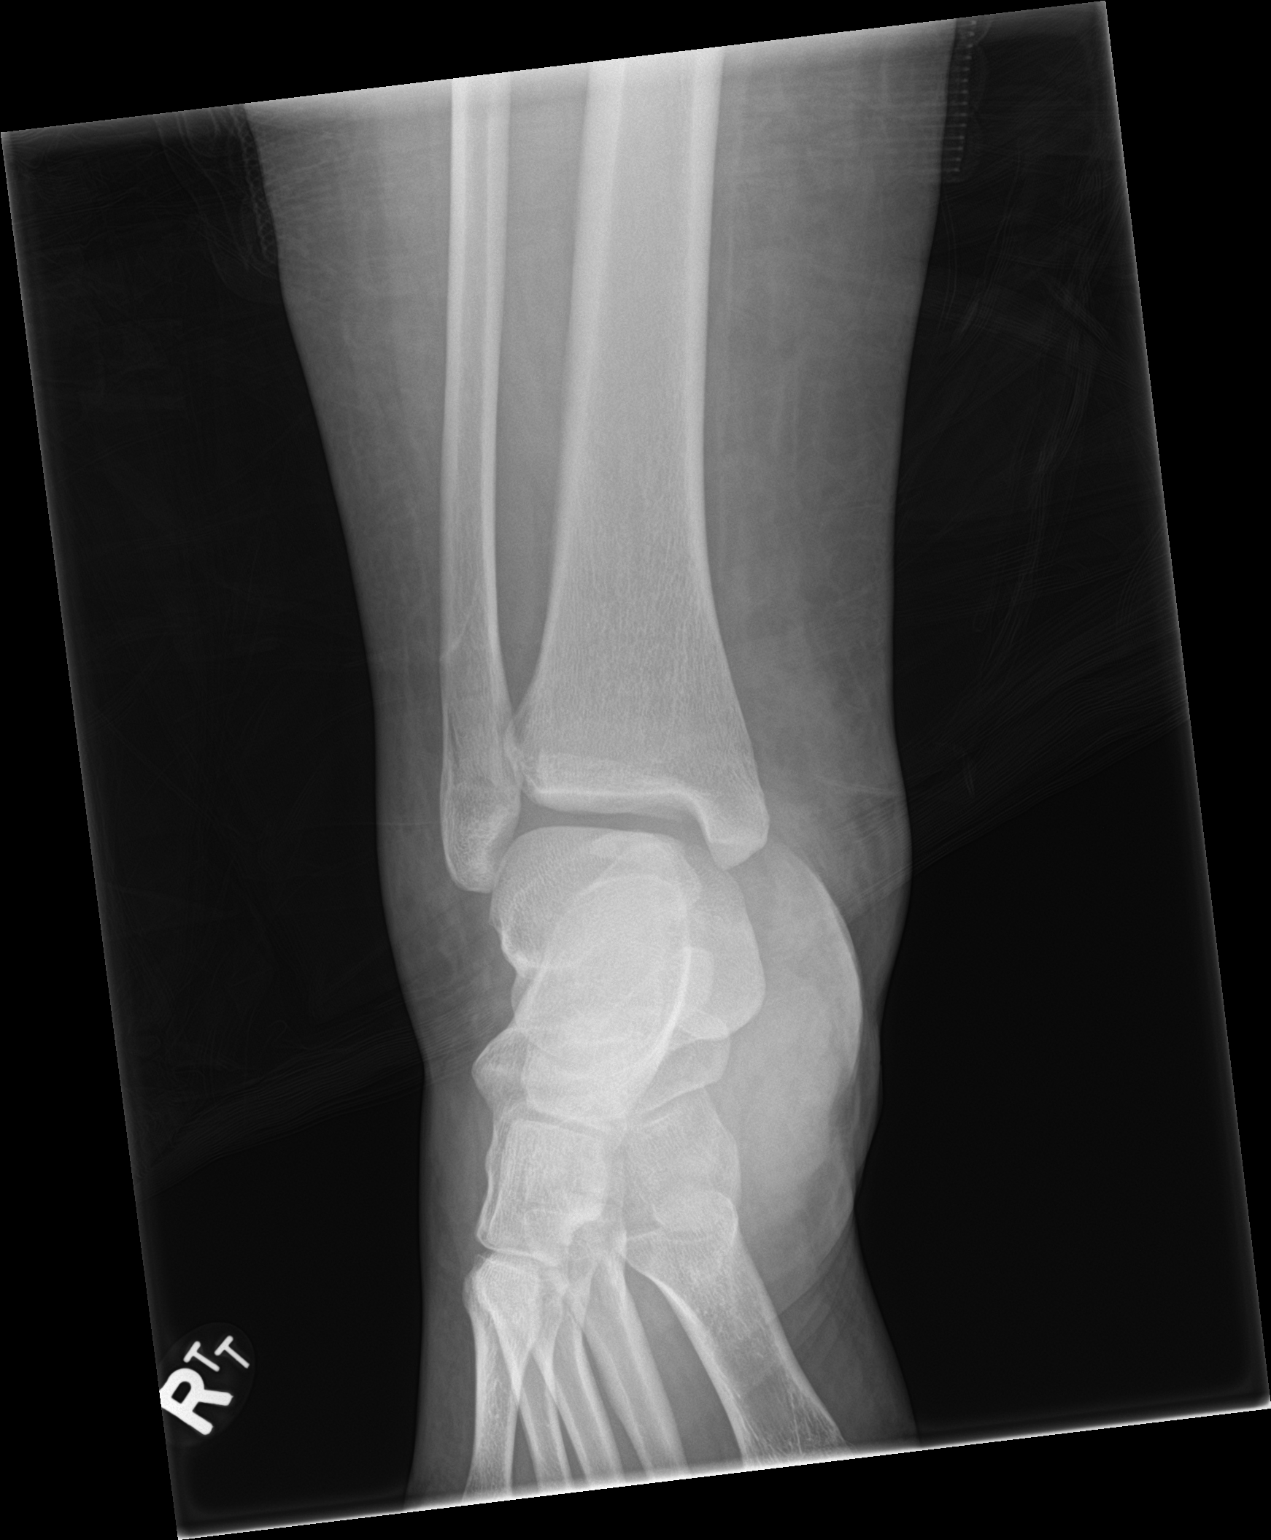

[ankle obl]
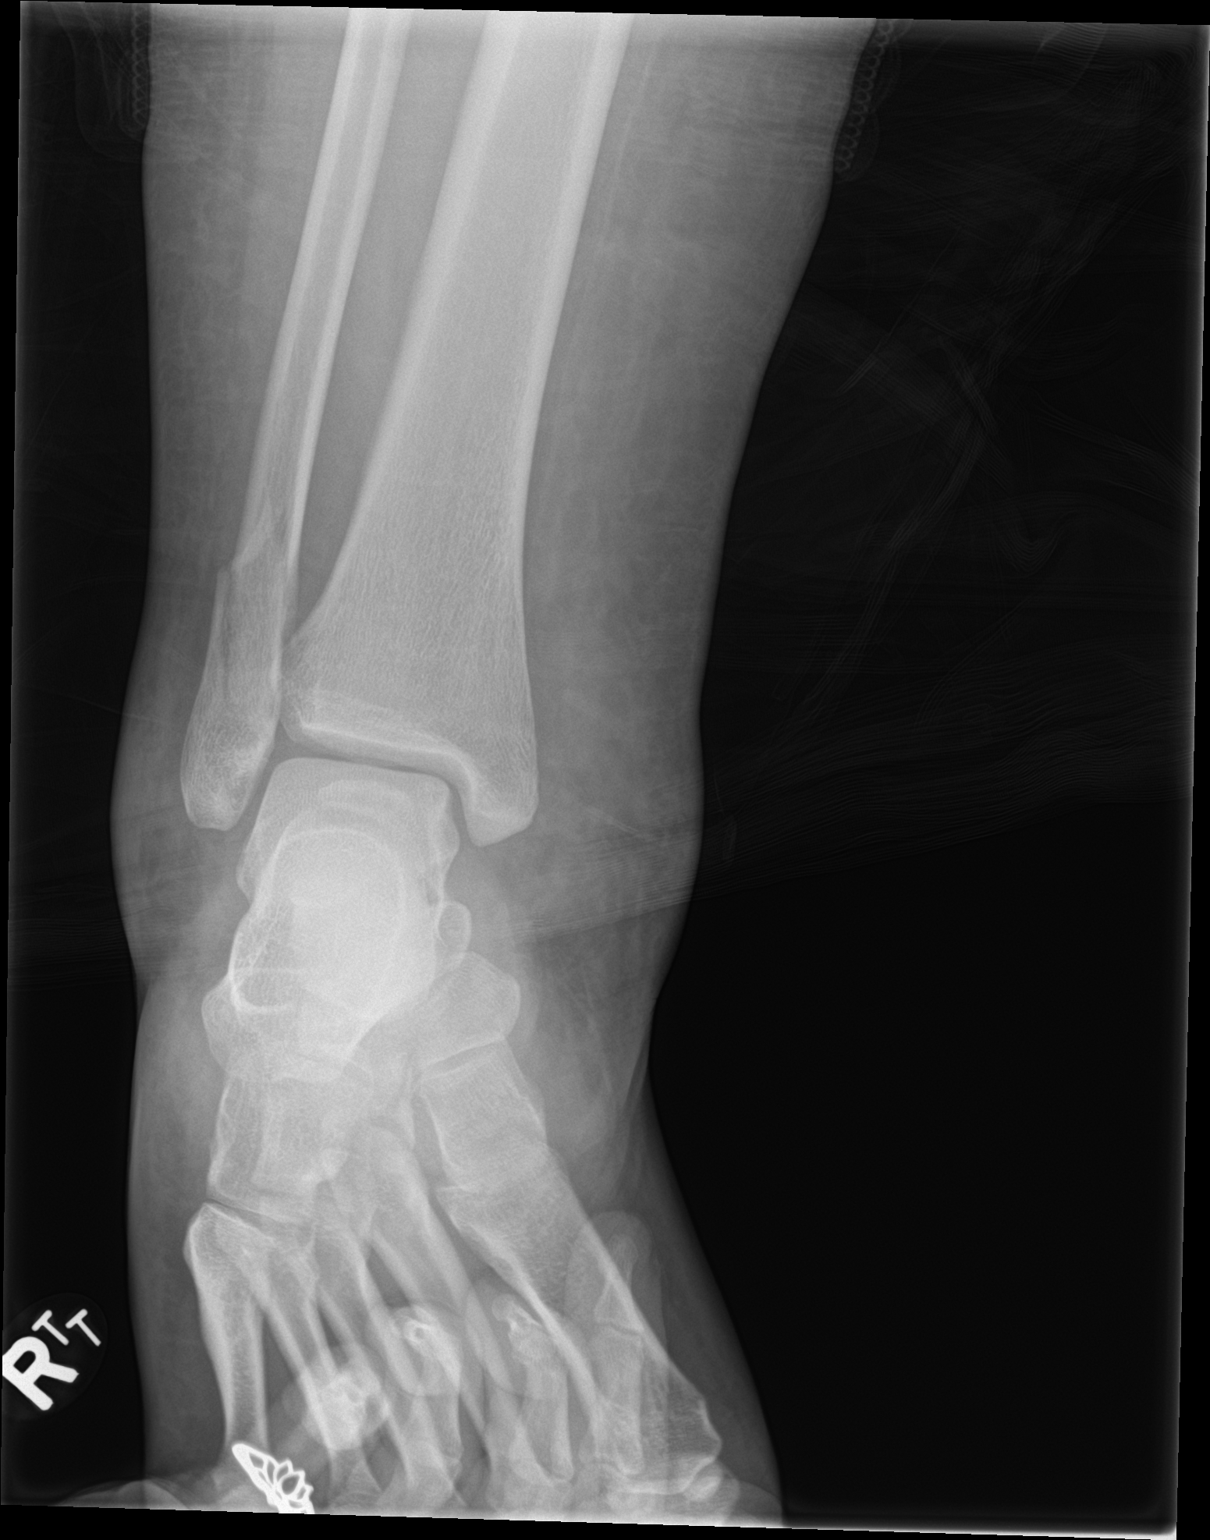

[ankle lat]
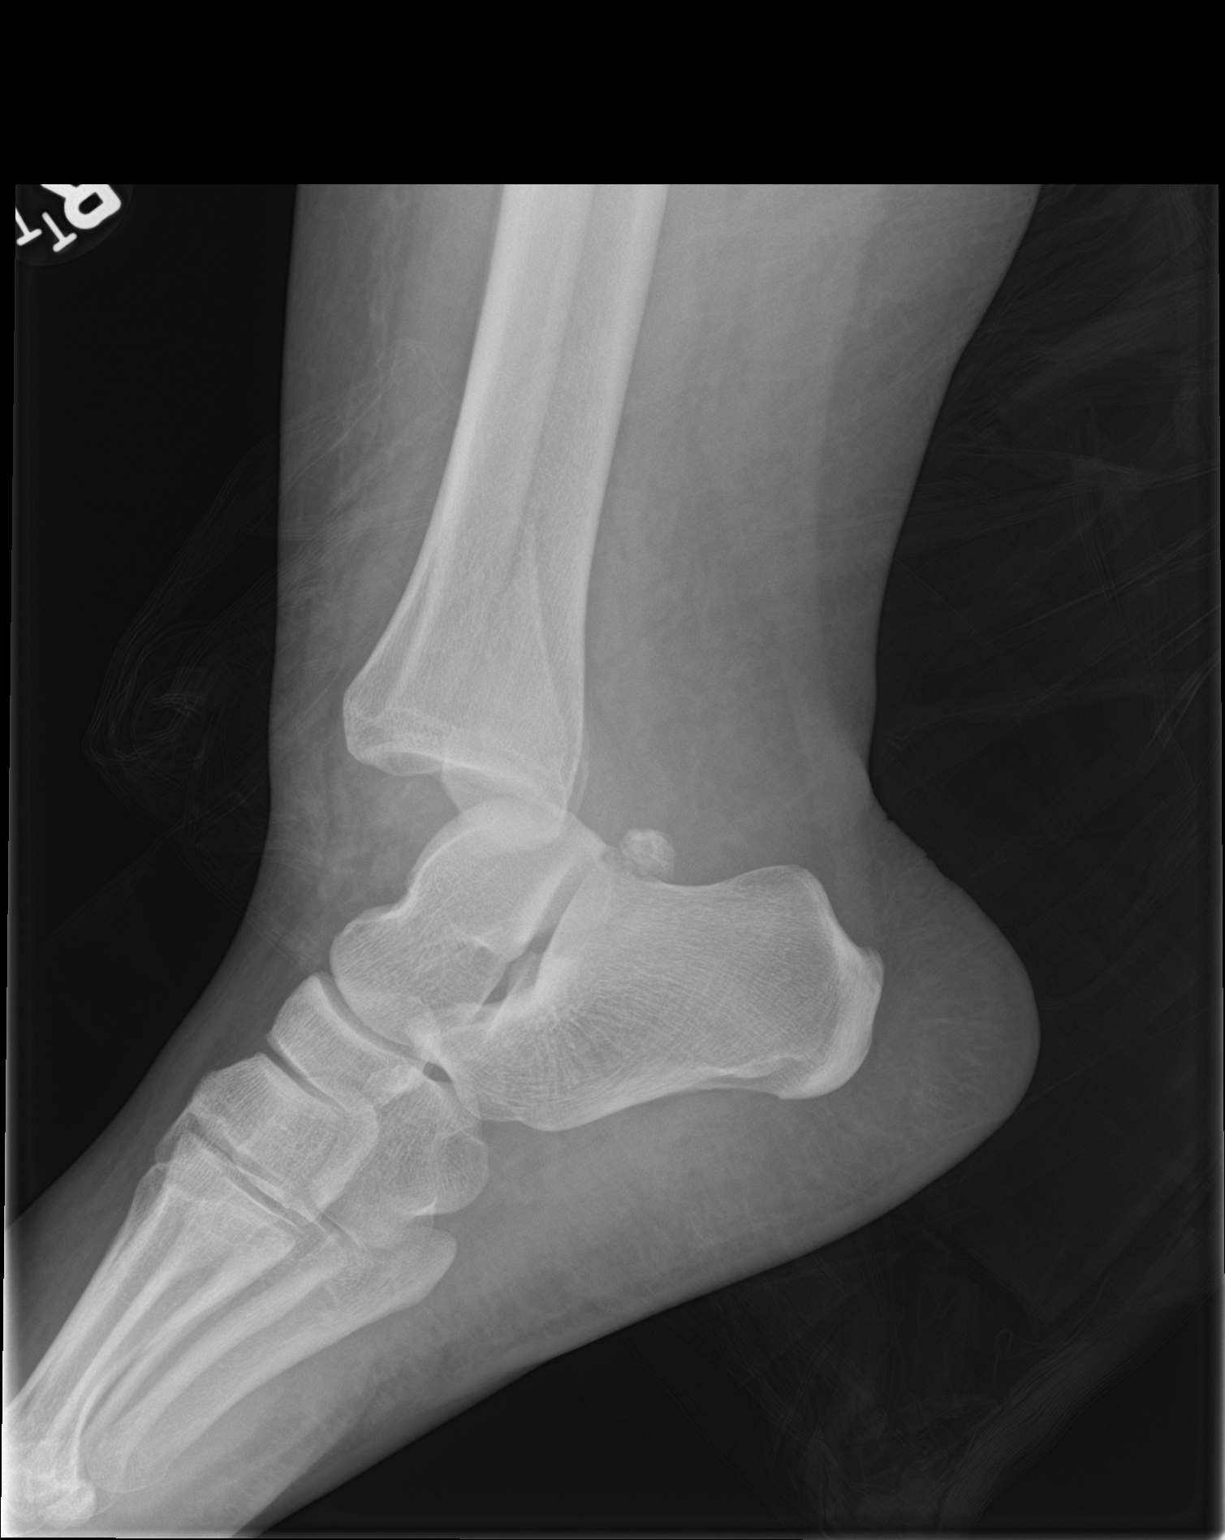

[3 of 3 positions shown; findings below may reference images not displayed]

FINDINGS: There is an oblique displaced fracture of the LATERAL malleolus.
There is an acute dislocation of the tibiotalar joint. Posterior and
MEDIAL malleoli are intact. Note is made of os trigonum.
IMPRESSION: Acute fracture dislocation of the RIGHT ankle.

## 2023-02-07 IMAGING — DX DG KNEE COMPLETE 4+V*R*
4 series · 4 of 4 positions shown · non-contrast
Comparison: None.

CLINICAL DATA: Fall

EXAM:
RIGHT KNEE - COMPLETE 4 VIEW

[knee ap]
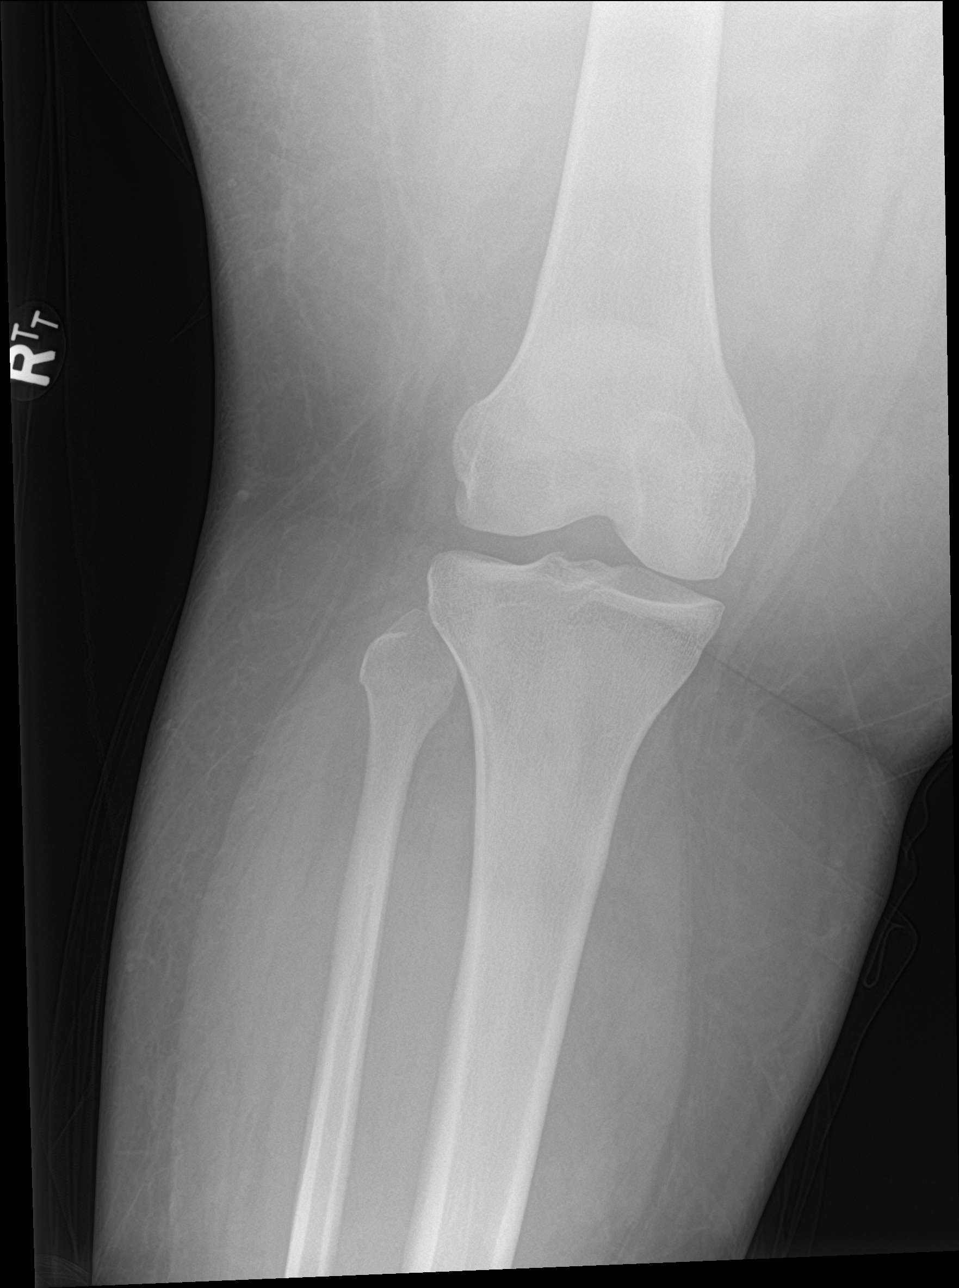

[knee obl (1 of 2)]
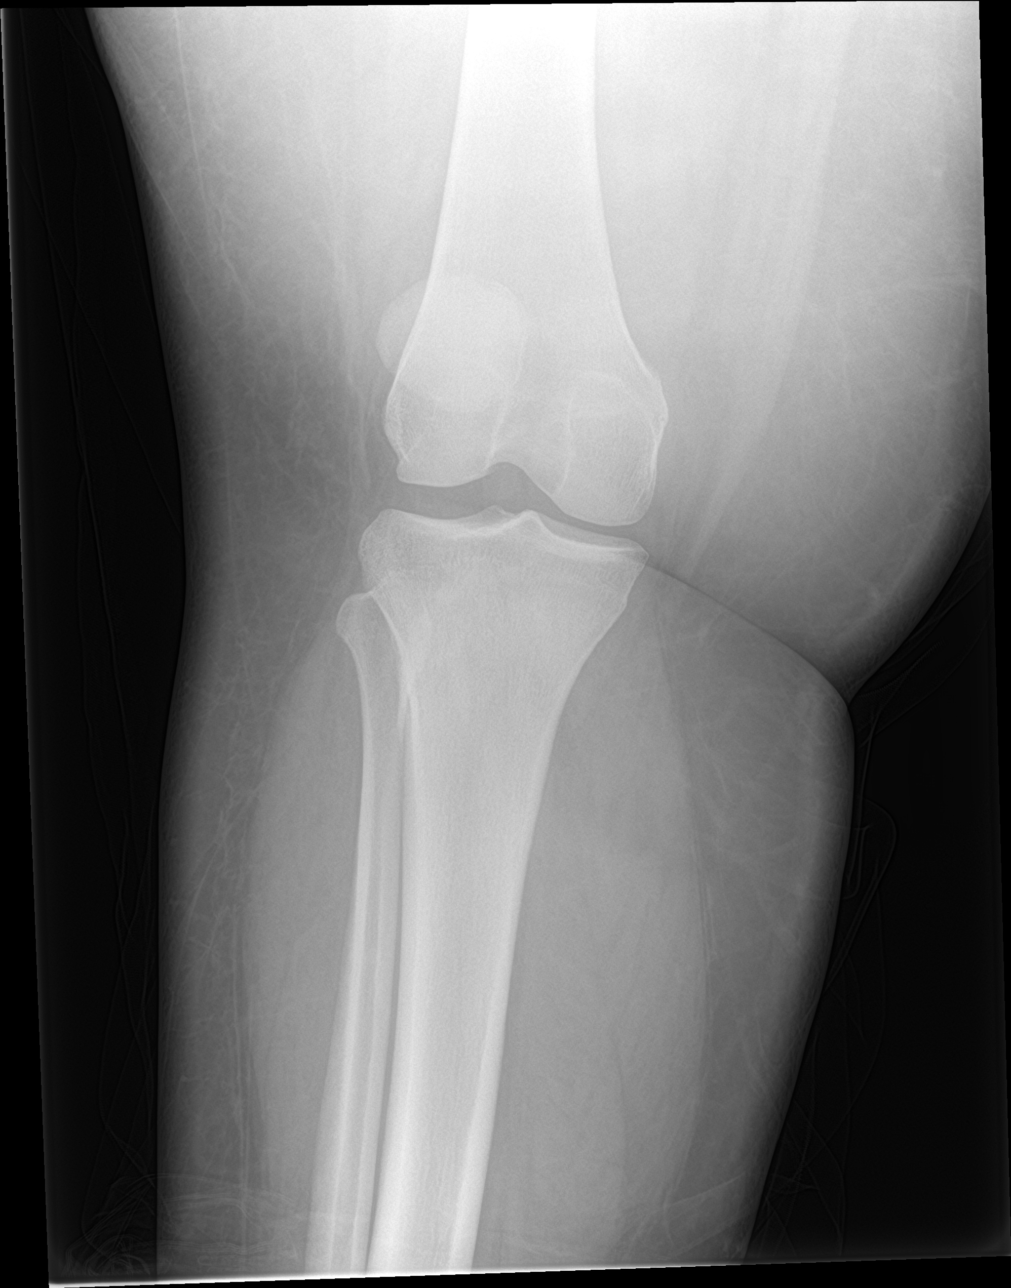

[knee obl (2 of 2)]
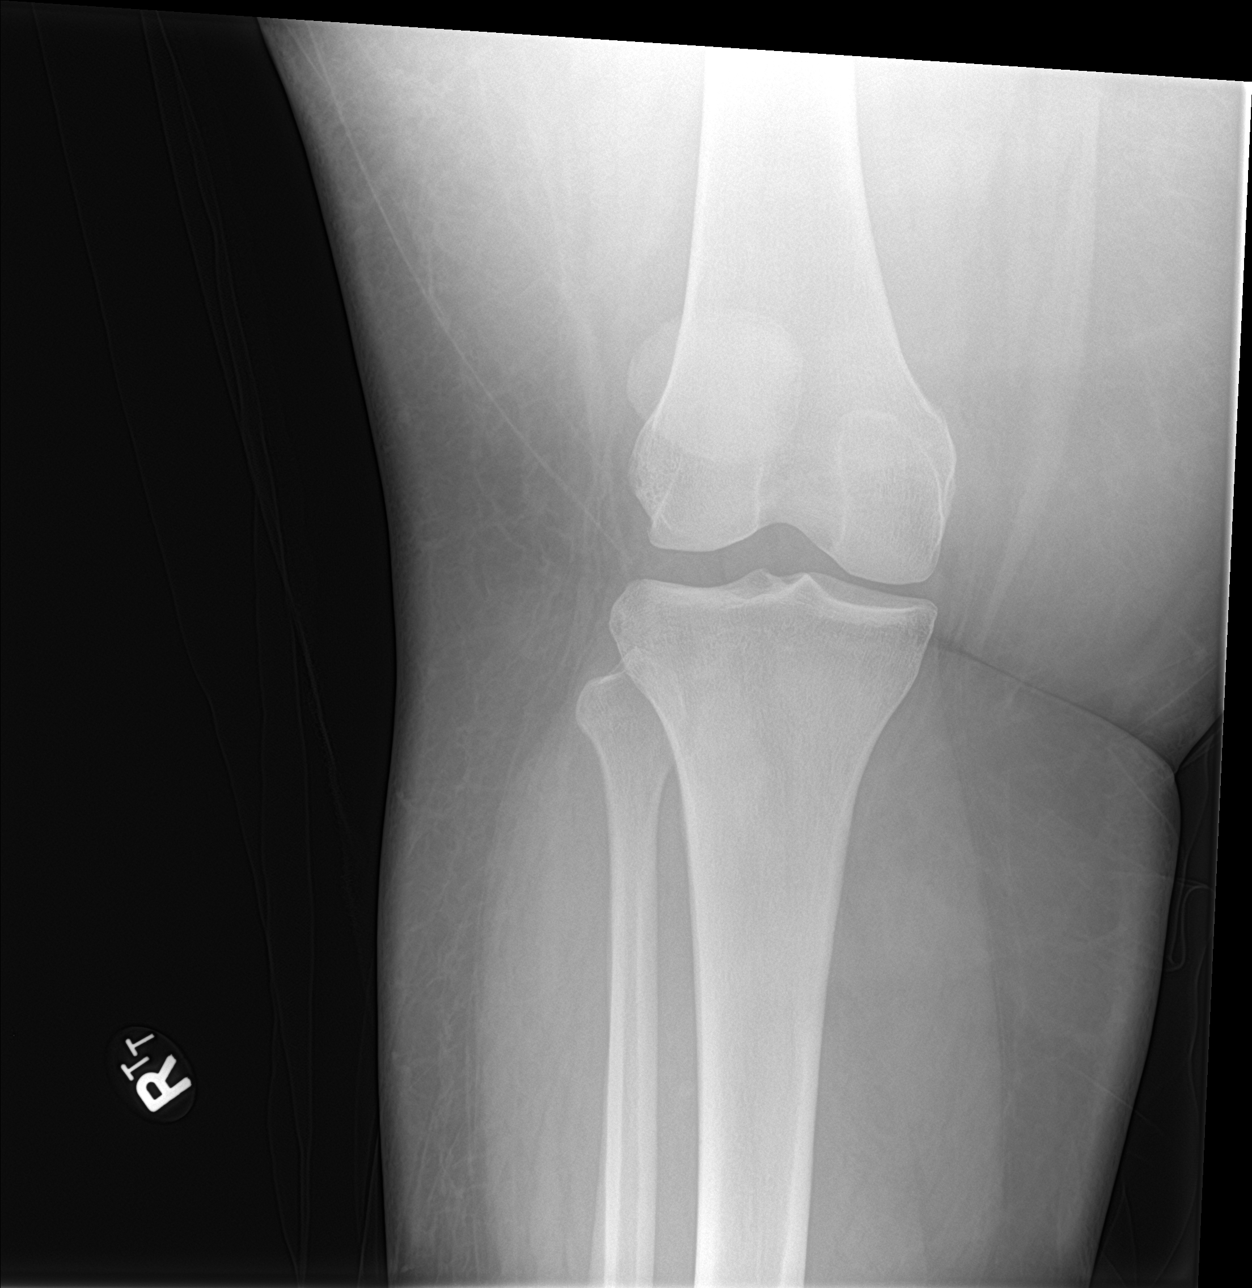

[knee lat]
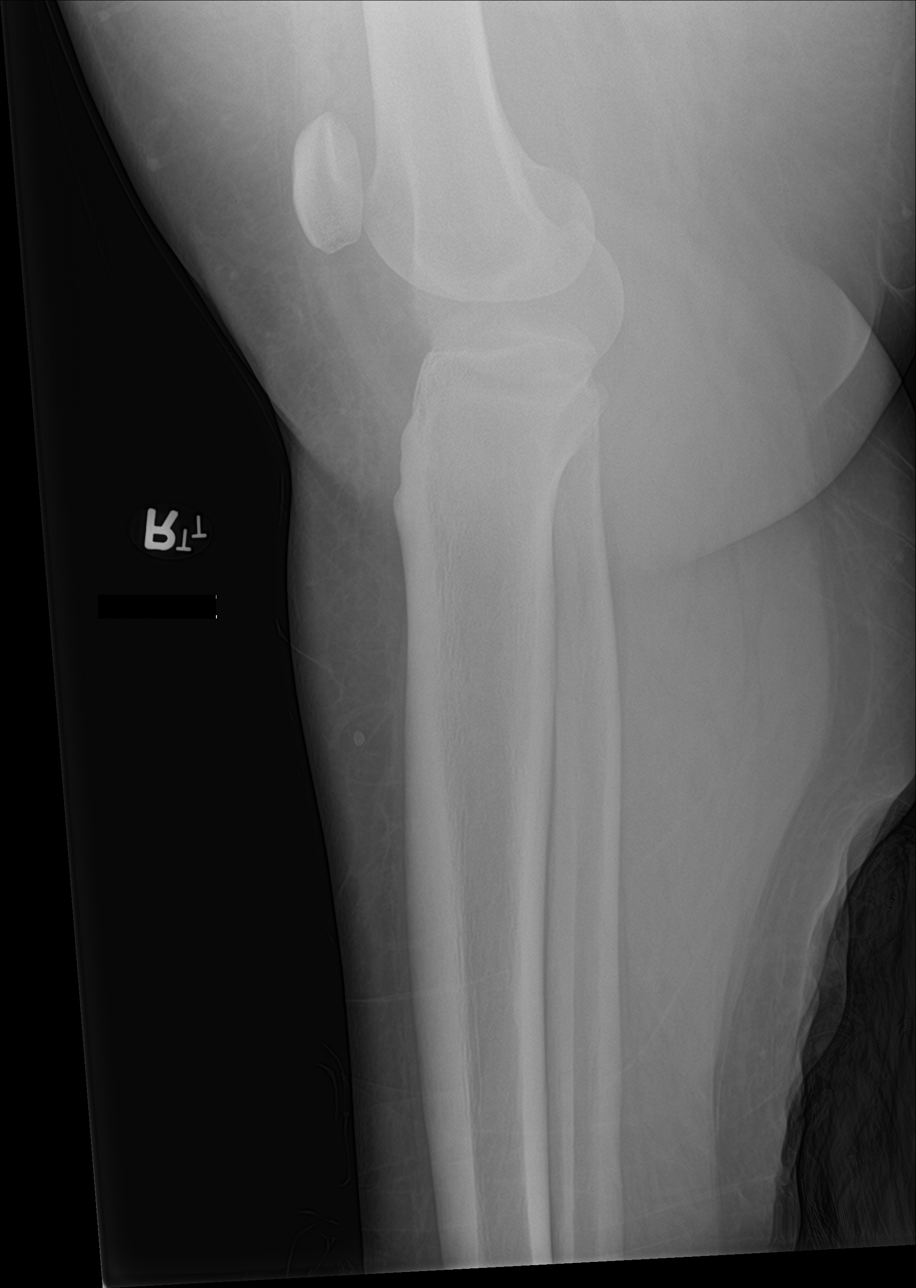

[4 of 4 positions shown; findings below may reference images not displayed]

FINDINGS: No evidence of fracture, dislocation, or joint effusion. No evidence
of arthropathy or other focal bone abnormality. Soft tissues are
unremarkable.
IMPRESSION: No acute osseous abnormality.

## 2023-02-13 IMAGING — RF DG ANKLE COMPLETE 3+V*R*
1 series · 10 of 10 positions shown · non-contrast
Comparison: 09/05/2021

CLINICAL DATA: ORIF

EXAM:
RIGHT ANKLE - COMPLETE 3+ VIEW

[Series 1: run · 10 of 10 slices shown]
[im 1/10]
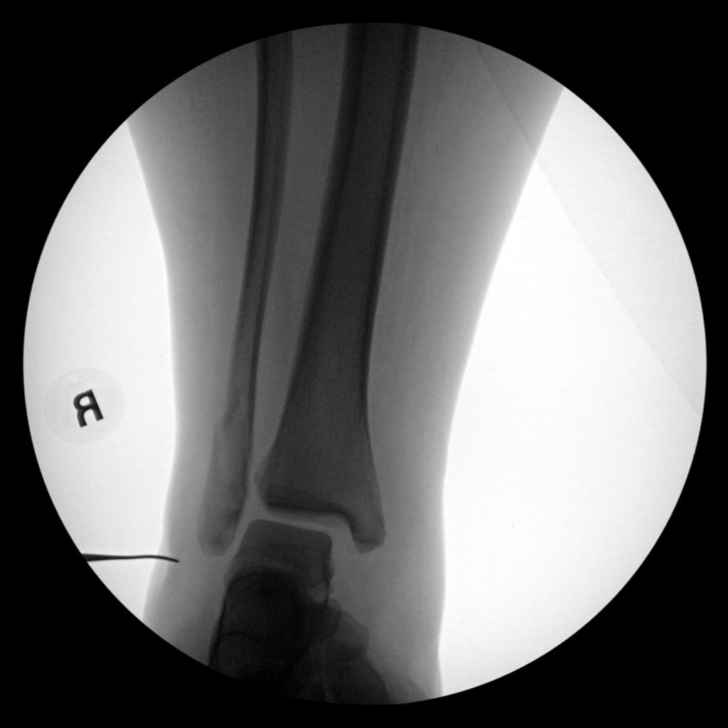
[im 2/10]
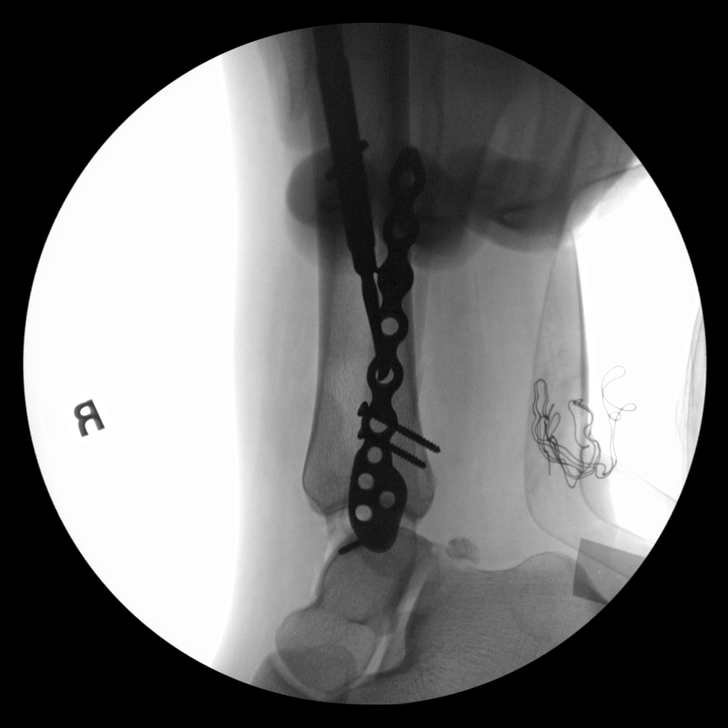
[im 3/10]
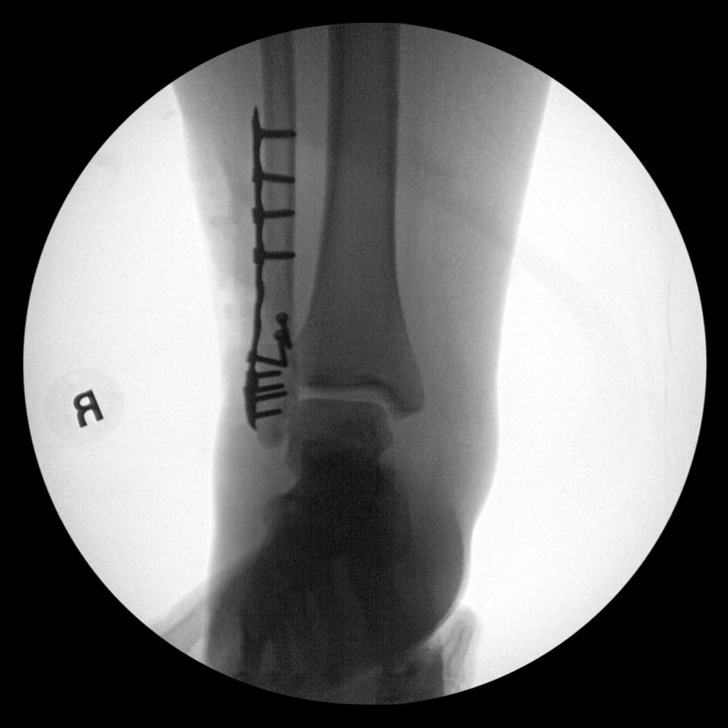
[im 4/10]
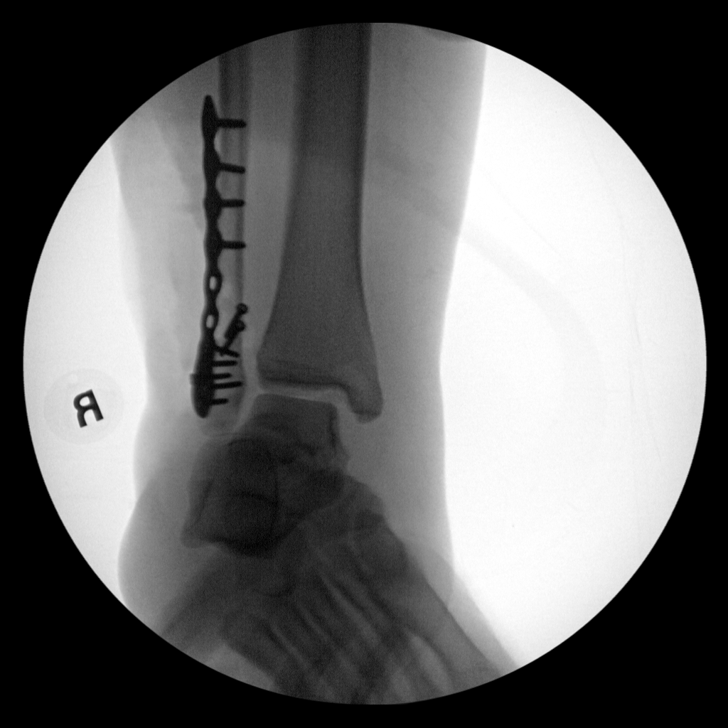
[im 5/10]
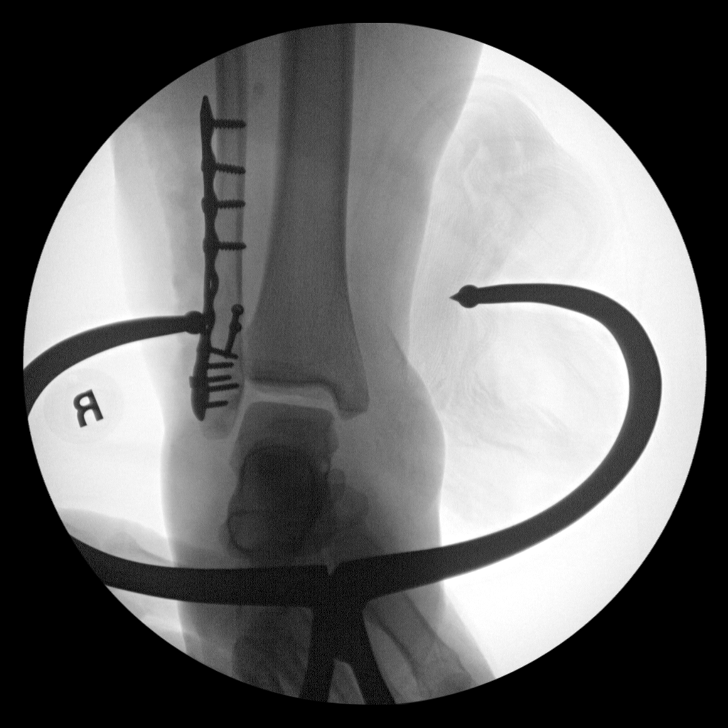
[im 6/10]
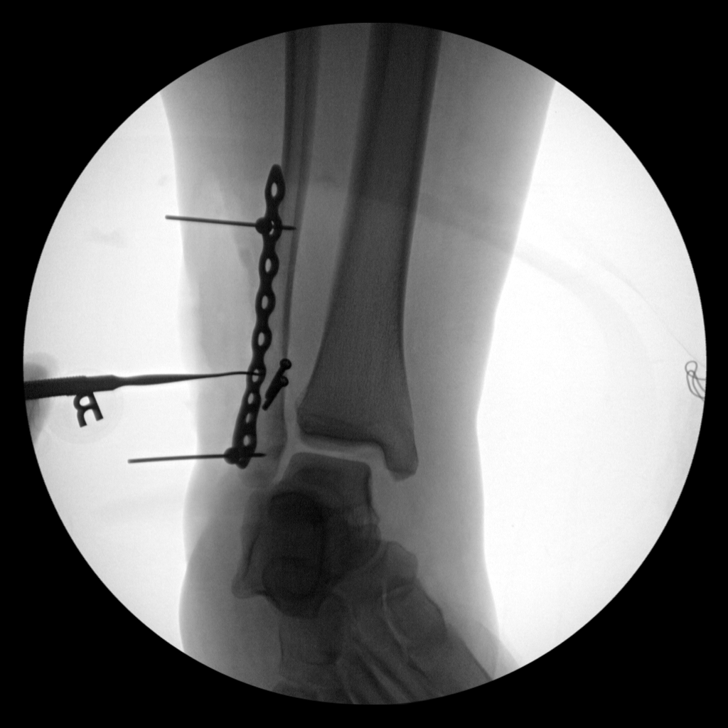
[im 7/10]
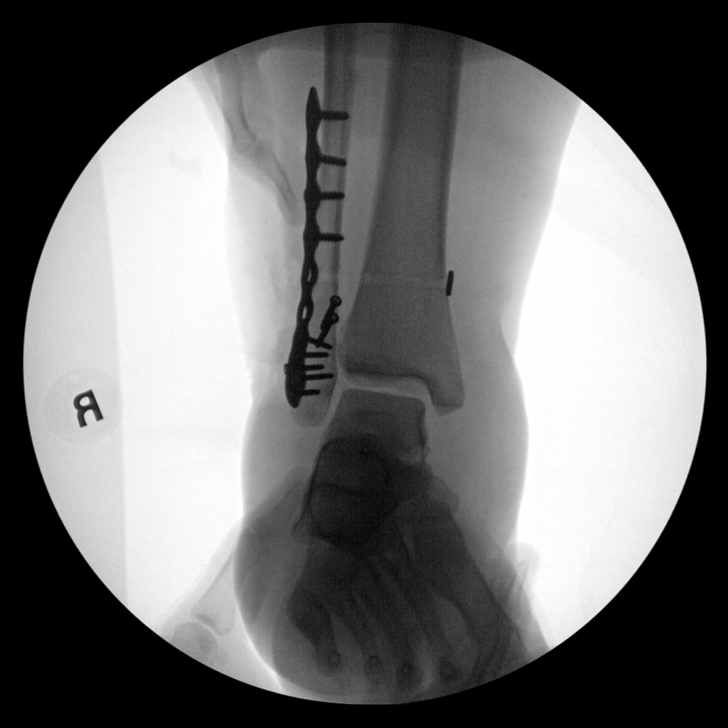
[im 8/10]
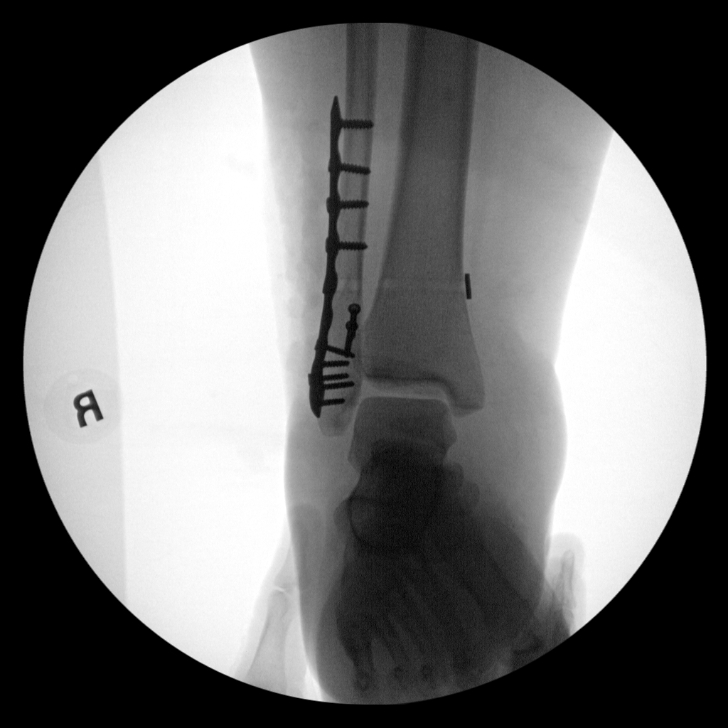
[im 9/10]
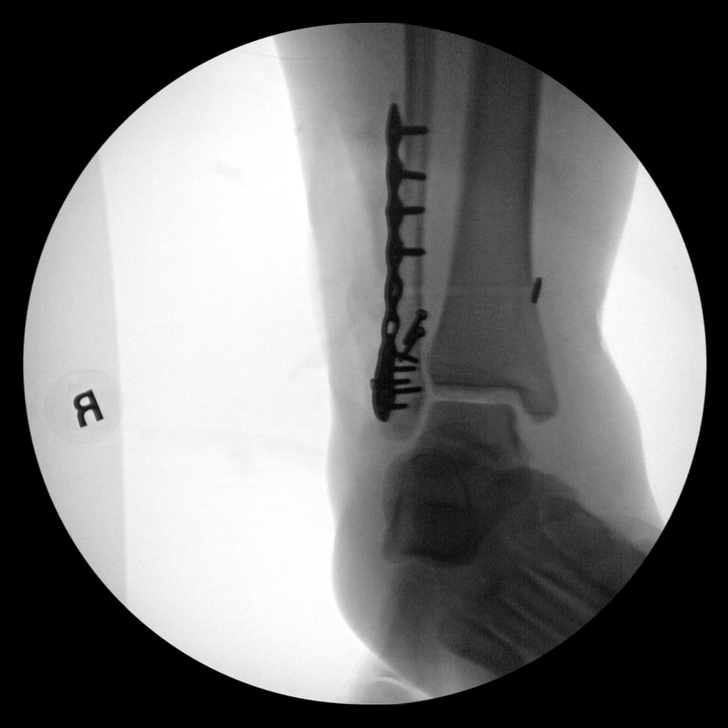
[im 10/10]
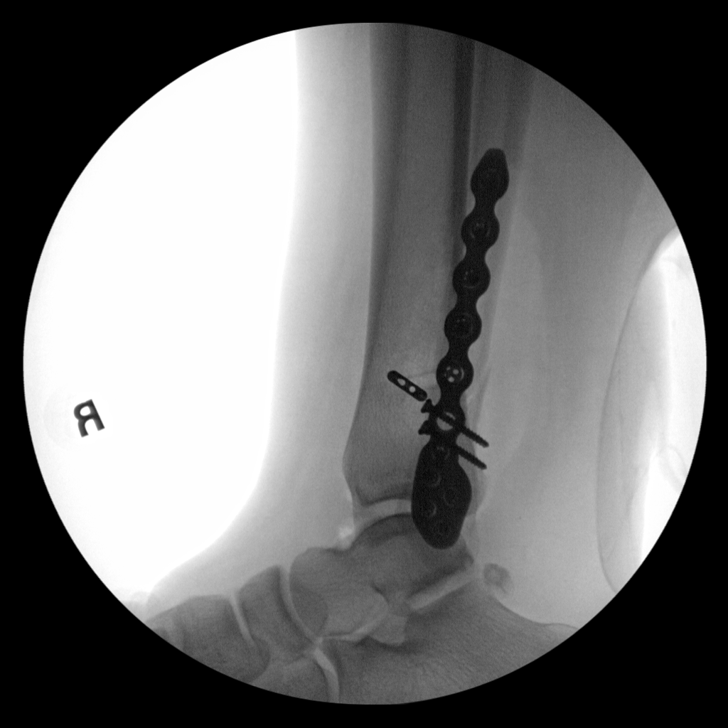

[10 of 10 positions shown; findings below may reference images not displayed]

FINDINGS: Ten fluoroscopic images are obtained during the performance of the
procedure and are provided for interpretation only. Malleable plate
and screw fixation traverses the distal fibular fracture seen
previously. Surgical anchors traversed the tibiofibular syndesmosis.
Alignment is anatomic. Please refer to operative report.

FLUOROSCOPY TIME:  50 seconds
IMPRESSION: 1. ORIF right ankle fracture.

## 2023-10-19 ENCOUNTER — Encounter: Payer: Self-pay | Admitting: Neurology

## 2023-10-19 ENCOUNTER — Ambulatory Visit: Payer: Medicaid Other | Admitting: Neurology

## 2023-10-19 VITALS — BP 130/84 | HR 76 | Ht 64.0 in | Wt >= 6400 oz

## 2023-10-19 DIAGNOSIS — H4710 Unspecified papilledema: Secondary | ICD-10-CM

## 2023-10-19 DIAGNOSIS — R0683 Snoring: Secondary | ICD-10-CM | POA: Diagnosis not present

## 2023-10-19 DIAGNOSIS — G43009 Migraine without aura, not intractable, without status migrainosus: Secondary | ICD-10-CM

## 2023-10-19 MED ORDER — ALPRAZOLAM 0.5 MG PO TABS: ORAL_TABLET | ORAL | 0 refills | Status: DC

## 2023-10-19 NOTE — Progress Notes (Signed)
[facility]  PATIENT: Beverly Hawkins DOB: October 04, 2001  REFERRING DOCTOR OR PCP:  My Conley Rolls, OD SOURCE: Patient, notes from optometry  _________________________________   HISTORICAL  CHIEF COMPLAINT:  Chief Complaint  Patient presents with   New Patient (Initial Visit)    Pt in room 10 alone. New patient for bilateral blurred disc margin optic nerve.  Pt reports no vision concerns. Pt report she has migraines 2-3 monthly.      HISTORY OF PRESENT ILLNESS:  I had the pleasure seeing your patient, Beverly Hawkins, at [facility] for neurologic consultation regarding her and headaches  She is a 22 year old woman who has a long history of migraine headaches.  These used to occur more frequently but recently, most months she has 2-3 migraines.  However, last month she had a migraine lasting 3 days.    With a migraine she has mild nausea but no vomiting.  She has photophobia and phonophobia.    Moving the head fast worsens the pain.    When a migraine occurs, she usually toughs it out or takes an OTC analgesic.  If able to nap, the migraine is almost always resolved upon awakening.      She recently was started on topiramate (2 weeks ago).  Too early to see if there is a benefit.   She also was prescribed sumatriptan.  She has tried it once with success - knocking the HA out in under 30 minutes.     She saw optometry due to the 3 day headache and photosensitivity.  She was noted to have blurred disc margins bilaterally.  Also had mild dry eyes but was otherwise normal.     She feels her weight is stable over the last year.   She weights 404 pounds and has a BMI = 69.4.     She snores loudly at night but has never been told she has gasping, pauses or snorting.   She does not report much excessive daytime sleepiness.   REVIEW OF SYSTEMS: Constitutional: No fevers, chills, sweats, or change in appetite Eyes: No visual changes, double vision, eye pain Ear, nose  and throat: No hearing loss, ear pain, nasal congestion, sore throat Cardiovascular: No chest pain, palpitations Respiratory:  No shortness of breath at rest or with exertion.   No wheezes GastrointestinaI: No nausea, vomiting, diarrhea, abdominal pain, fecal incontinence Genitourinary:  No dysuria, urinary retention or frequency.  No nocturia. Musculoskeletal:  No neck pain, back pain Integumentary: No rash, pruritus, skin lesions Neurological: as above Psychiatric: No depression at this time.  No anxiety Endocrine: No palpitations, diaphoresis, change in appetite, change in weigh or increased thirst Hematologic/Lymphatic:  No anemia, purpura, petechiae. Allergic/Immunologic: No itchy/runny eyes, nasal congestion, recent allergic reactions, rashes  ALLERGIES: Allergies  Allergen Reactions   Benadryl [Diphenhydramine]     Causes finger tips to have small bumps   Latex Itching    Unsure of reaction    HOME MEDICATIONS:  Current Outpatient Medications:    IMITREX 25 MG tablet, Take 25 mg by mouth every 2 (two) hours as needed for migraine., Disp: , Rfl:    topiramate (TOPAMAX) 25 MG tablet, Take 25 mg by mouth 2 (two) times daily., Disp: , Rfl:    XIIDRA 5 % SOLN, Apply 1 drop to eye 2 (two) times daily., Disp: , Rfl:    Misc. Devices (FREE SPIRIT KNEE/LEG WALKER) MISC, Use for ambulation (Patient not taking: Reported on 12/21/2022), Disp: 1 each,  Rfl: 0   ondansetron (ZOFRAN ODT) 4 MG disintegrating tablet, Take 1 tablet (4 mg total) by mouth every 8 (eight) hours as needed for nausea or vomiting. (Patient not taking: Reported on 10/22/2021), Disp: 20 tablet, Rfl: 0  PAST MEDICAL HISTORY: Past Medical History:  Diagnosis Date   Headache(784.0)    Obesity    Reflux gastritis    Walker as ambulation aid    and crutches    PAST SURGICAL HISTORY: Past Surgical History:  Procedure Laterality Date   MANIPULATION ANKLE Right    with sedation   ORIF ANKLE FRACTURE Right 09/11/2021    Procedure: OPEN REDUCTION INTERNAL FIXATION (ORIF) ANKLE FRACTURE;  Surgeon: Joen Laura, MD;  Location: [facility];  Service: Orthopedics;  Laterality: Right;    FAMILY HISTORY: Family History  Problem Relation Age of Onset   Bipolar disorder Mother    Breast cancer Paternal Grandmother    Stroke Paternal Grandmother     SOCIAL HISTORY: Social History   Socioeconomic History   Marital status: Single    Spouse name: Not on file   Number of children: Not on file   Years of education: Not on file   Highest education level: Not on file  Occupational History   Not on file  Tobacco Use   Smoking status: Never   Smokeless tobacco: Never  Vaping Use   Vaping status: Never Used  Substance and Sexual Activity   Alcohol use: Yes    Comment: socially - wine   Drug use: No   Sexual activity: Never    Birth control/protection: None  Other Topics Concern   Not on file  Social History Narrative   Right handed    Drinks one cup coffee per day   Drinks soda one per day    Social Determinants of Health   Financial Resource Strain: Not on file  Food Insecurity: Not on file  Transportation Needs: Not on file  Physical Activity: Not on file  Stress: Not on file  Social Connections: Not on file  Intimate Partner Violence: Not on file       PHYSICAL EXAM  Vitals:   10/19/23 0814  BP: 130/84  Pulse: 76  Weight: (!) 404 lb 3.2 oz (183.3 kg)  Height: 5\' 4"  (1.626 m)    Body mass index is 69.38 kg/m.   General: The patient is well-developed and well-nourished and in no acute distress  HEENT:  Head is /AT.  Sclera are anicteric.  Funduscopic exam shows normal optic discs and retinal vessels.  Neck: No carotid bruits are noted.  The neck is nontender.  Cardiovascular: The heart has a regular rate and rhythm with a normal S1 and S2. There were no murmurs, gallops or rubs.    Skin: Extremities are without rash or  edema.  Musculoskeletal:  Back is  nontender  Neurologic Exam  Mental status: The patient is alert and oriented x 3 at the time of the examination. The patient has apparent normal recent and remote memory, with an apparently normal attention span and concentration ability.   Speech is normal.  Cranial nerves: Extraocular movements are full. Pupils are equal, round, and reactive to light and accomodation.  Visual fields are full.  Facial symmetry is present. There is good facial sensation to soft touch bilaterally.Facial strength is normal.  Trapezius and sternocleidomastoid strength is normal. No dysarthria is noted.  The tongue is midline, and the patient has symmetric elevation of the soft palate. No obvious hearing  deficits are noted.  Motor:  Muscle bulk is normal.   Tone is normal. Strength is  5 / 5 in all 4 extremities.   Sensory: Sensory testing is intact to pinprick, soft touch and vibration sensation in all 4 extremities.  Coordination: Cerebellar testing reveals good finger-nose-finger and heel-to-shin bilaterally.  Gait and station: Station is normal.   Gait is normal. Tandem gait is normal. Romberg is negative.   Reflexes: Deep tendon reflexes are symmetric and normal bilaterally.   Plantar responses are flexor.    DIAGNOSTIC DATA (LABS, IMAGING, TESTING) - I reviewed patient records, labs, notes, testing and imaging myself where available.  Lab Results  Component Value Date   WBC 8.5 09/11/2021   HGB 12.9 09/11/2021   HCT 41.6 09/11/2021   MCV 79.8 (L) 09/11/2021   PLT 303 09/11/2021        ASSESSMENT AND PLAN  Migraine without aura and without status migrainosus, not intractable - Plan: MR BRAIN W WO CONTRAST  Optic nerve edema - Plan: MR BRAIN W WO CONTRAST  Morbid obesity (HCC) - Plan: MR BRAIN W WO CONTRAST    In summary, Beverly Hawkins is a 22 year old woman with common migraine headaches who was noted to have mild optic disc edema on optometric evaluation.  On exam today, she has slightly  blurred disc margins.  Due to her high BMI, she is at risk for idiopathic intracranial hypertension and we will check an MRI of the brain to determine if there is any other cause.  If the MRI of the brain is normal or just shows changes of elevated pressure, we will recommend a lumbar puncture to determine the opening pressure.  If elevated acetazolamide can be added (switched to methazolamide if tolerability issues) Topamax was recently started.  Because it overlaps this would be discontinued.  For the migraine she will continue to take Imitrex as needed.  She had a good result the 1 time she took it.  She has a very high BMI and snores at night.  The pharynx is Mallampati 3 and she is at significant risk of obstructive sleep apnea.  We will check a home sleep study and recommend CPAP based on the resultshome sleep  Thank you for asked me to see Beverly Hawkins.  Please let me know if I can be of further assistance with her or other Timofey Carandang A. Epimenio Foot, MD, [facility] 10/19/2023, 8:43 AM Certified in Neurology, Clinical Neurophysiology, Sleep Medicine and Neuroimaging  [facility] [address] [place], [place] [address] [phone]

## 2023-10-25 ENCOUNTER — Telehealth: Payer: Self-pay | Admitting: Neurology

## 2023-10-25 NOTE — Telephone Encounter (Signed)
HST [employer] pending faxed notes

## 2023-10-25 NOTE — Telephone Encounter (Signed)
[employer] Berkley Harvey: [id number] exp. 10/25/23-11/24/23 sent to GI [phone]

## 2023-10-25 NOTE — Telephone Encounter (Signed)
[facility]ST [employer] no auth req via fax form

## 2023-11-23 ENCOUNTER — Ambulatory Visit: Payer: Medicaid Other | Admitting: Neurology

## 2023-11-23 DIAGNOSIS — G4733 Obstructive sleep apnea (adult) (pediatric): Secondary | ICD-10-CM

## 2023-11-23 DIAGNOSIS — R0683 Snoring: Secondary | ICD-10-CM

## 2023-11-24 NOTE — Progress Notes (Signed)
   [facility]  HOME SLEEP STUDY  STUDY DATE: 11/23/2023 PATIENT NAME: Beverly Hawkins DOB: 03-09-2001 MRN: [id number]  ORDERING CLINICIAN: Richard A. Epimenio Foot, MD, PhD INTERPRETING CLINICIAN: Richard A. Sater, MD. PhD   CLINICAL INFORMATION: 22 year old woman with snoring and BMI = 69.4   IMPRESSION:  Severe obstructive sleep apnea with a pAHI 3% equals 49.1/h.  OSA was even more severe during REM sleep with REM pAHI 3% equal 63/h. Nocturnal hypoxemia.  SaO2 less than than 88% occurred during 5.8% of sleep and SaO2 less than 85% occurred during 2.9% of sleep   RECOMMENDATION: AutoPap 5 to 20 cm H2O pressure with heated humidifier. Consider oximetry once acclimated to CPAP to determine if nocturnal hypoxemia persists Follow-up with Dr. Epimenio Foot   INTERPRETING PHYSICIAN:   Pearletha Furl. Epimenio Foot, MD, PhD, [facility] Certified in Neurology, Clinical Neurophysiology, Sleep Medicine, Pain Medicine and Neuroimaging  [facility] [address], [address] [place], [place] [address] [phone]

## 2023-11-28 ENCOUNTER — Telehealth: Payer: Self-pay | Admitting: *Deleted

## 2023-11-28 DIAGNOSIS — G4733 Obstructive sleep apnea (adult) (pediatric): Secondary | ICD-10-CM

## 2023-11-28 NOTE — Telephone Encounter (Signed)
Left message for patient to call to discuss results.

## 2023-11-28 NOTE — Telephone Encounter (Signed)
-----   Message from Asa Lente sent at 11/27/2023  3:28 PM EST ----- Regarding: Home sleep study Please let the patient know that her home sleep study showed that she had severe obstructive sleep apnea and I would like to start her on AutoPap with a pressure +5 to +20 cm H2O with heated humidifier

## 2023-12-15 ENCOUNTER — Encounter: Payer: Self-pay | Admitting: Neurology

## 2023-12-20 ENCOUNTER — Telehealth: Payer: Self-pay | Admitting: Neurology

## 2023-12-20 NOTE — Telephone Encounter (Signed)
 Pt called to confirm if her insurance would pay for the cost of her MRI, MRI coordinator responded to pt's concern.

## 2023-12-21 ENCOUNTER — Other Ambulatory Visit: Payer: Self-pay | Admitting: Neurology

## 2023-12-21 ENCOUNTER — Ambulatory Visit
Admission: RE | Admit: 2023-12-21 | Discharge: 2023-12-21 | Disposition: A | Discharge status: Home / Self Care | Payer: Medicaid Other | Source: Ambulatory Visit | Attending: Neurology | Admitting: Neurology

## 2023-12-21 DIAGNOSIS — G43009 Migraine without aura, not intractable, without status migrainosus: Secondary | ICD-10-CM

## 2023-12-21 DIAGNOSIS — H4710 Unspecified papilledema: Secondary | ICD-10-CM

## 2023-12-21 MED ORDER — AZITHROMYCIN 250 MG PO TABS: ORAL_TABLET | ORAL | 0 refills | Status: DC

## 2023-12-21 MED ORDER — GADOPICLENOL 0.5 MMOL/ML IV SOLN
10.0000 mL | Freq: Once | INTRAVENOUS | Status: AC | PRN
  Administered 2023-12-21: 10 mL via INTRAVENOUS

## 2024-01-10 NOTE — Addendum Note (Signed)
Addended by: Aura Camps on: 01/10/2024 03:12 PM   Modules accepted: Orders

## 2024-02-16 NOTE — Progress Notes (Addendum)
 Patient: Beverly Hawkins Date of Birth: 10/12/2001  Reason for Visit: Follow up History from: Patient Primary Neurologist: Sater   ASSESSMENT AND PLAN 23 y.o. year old female   1.  Chronic migraine headache 2.  Optic nerve edema 3.  Morbid obesity 4.  OSA, severe on CPAP  -Denies any headache or vision change -Claims never started Topamax  -BMI continues to be 69, 418 pounds, having first appointment at the weight loss clinic next week -Referral to ophthalmology Dr. Candi Chafe for evaluation of current state of papilledema.  She remains asymptomatic.  She wishes to hold off on lumbar puncture until repeat ophthalmology evaluation. -She just started her CPAP 02/13/24, I will see her back within 30 to 90 days of starting CPAP -She should let me know right away if any increase in headache or vision change, would order lumbar puncture -MRI of the brain was unrevealing with the exception of sinusitis -I will follow-up with her at her CPAP appointment  Addendum 03/28/24 SS: I received ophthalmology evaluation from Dr. Candi Chafe 03/14/24, possible mild disc edema nasally bilaterally.  Nerves do not appear swollen on visual inspection.  OCT is equivocal with possible mild disc swelling bilaterally versus normal variant.  Suspicion that this does not represent true papilledema.  Recommended repeating OCT in a few months.  I called the patient, she denies headache or vision change.  Has not had any indication of a headache in 2 months.  She is closely following with a weight loss clinic, has made several healthy lifestyle changes.  She has just started [facility].  She prefers to hold off on lumbar puncture, I discussed symptoms of IIH.  She will keep follow-up with ophthalmology.  Stay in touch.  HISTORY OF PRESENT ILLNESS: Today 02/17/24 Saw Dr. Godwin Lat November 2024 felt to be at risk for IIH due to high BMI.  MRI of the brain was ordered.  Sleep study.  HST in December 2024 showed severe OSA, recommended  starting CPAP.  MRI of the brain with and without contrast showed sinusitis.  Azithromycin  was sent in. Looks like she just started CPAP 02/13/24. Mentions haven't had any headaches frequently. Has only taken Imitrex once since last seen. Going to weight loss clinic next week for initial visit. Not clear she ever started the Topamax . Has not been back to see eye doctor. No vision changes, no dizziness. Her current weight is 418 BMI is 69.   IMPRESSION: This MRI of the brain with and without contrast shows the following: The brain appears normal before and after contrast. Maxillary, ethmoid, right frontal and sphenoid sinusitis with mixed acute and chronic features. Normal enhancement pattern.  HISTORY  10/19/23 Dr. Godwin Lat: I had the pleasure seeing your patient, Beverly Hawkins, at [facility] for neurologic consultation regarding her and headaches   She is a 23 year old woman who has a long history of migraine headaches.  These used to occur more frequently but recently, most months she has 2-3 migraines.  However, last month she had a migraine lasting 3 days.    With a migraine she has mild nausea but no vomiting.  She has photophobia and phonophobia.    Moving the head fast worsens the pain.    When a migraine occurs, she usually toughs it out or takes an OTC analgesic.  If able to nap, the migraine is almost always resolved upon awakening.       She recently was started on topiramate  (2 weeks ago).  Too early  to see if there is a benefit.   She also was prescribed sumatriptan.  She has tried it once with success - knocking the HA out in under 30 minutes.      She saw optometry due to the 3 day headache and photosensitivity.  She was noted to have blurred disc margins bilaterally.  Also had mild dry eyes but was otherwise normal.      She feels her weight is stable over the last year.   She weights 404 pounds and has a BMI = 69.4.     She snores loudly at night but has never been  told she has gasping, pauses or snorting.   She does not report much excessive daytime sleepiness.   REVIEW OF SYSTEMS: Out of a complete 14 system review of symptoms, the patient complains only of the following symptoms, and all other reviewed systems are negative.  See HPI  ALLERGIES: Allergies  Allergen Reactions   Benadryl  [Diphenhydramine ]     Causes finger tips to have small bumps   Latex Itching    Unsure of reaction    HOME MEDICATIONS: Outpatient Medications Prior to Visit  Medication Sig Dispense Refill   IMITREX 25 MG tablet Take 25 mg by mouth every 2 (two) hours as needed for migraine.     ALPRAZolam  (XANAX ) 0.5 MG tablet Take 2 or 3 po before MRI.    Must have a driver. 3 tablet 0   azithromycin  (ZITHROMAX  Z-PAK) 250 MG tablet Take 2 p.o. on day 1 and then take 1 p.o. daily for the next 4 days 6 each 0   Misc. Devices (FREE SPIRIT KNEE/LEG WALKER) MISC Use for ambulation (Patient not taking: Reported on 12/21/2022) 1 each 0   ondansetron  (ZOFRAN  ODT) 4 MG disintegrating tablet Take 1 tablet (4 mg total) by mouth every 8 (eight) hours as needed for nausea or vomiting. (Patient not taking: Reported on 10/22/2021) 20 tablet 0   topiramate  (TOPAMAX ) 25 MG tablet Take 25 mg by mouth 2 (two) times daily.     XIIDRA 5 % SOLN Apply 1 drop to eye 2 (two) times daily.     No facility-administered medications prior to visit.    PAST MEDICAL HISTORY: Past Medical History:  Diagnosis Date   Headache(784.0)    Obesity    Reflux gastritis    Walker as ambulation aid    and crutches    PAST SURGICAL HISTORY: Past Surgical History:  Procedure Laterality Date   MANIPULATION ANKLE Right    with sedation   ORIF ANKLE FRACTURE Right 09/11/2021   Procedure: OPEN REDUCTION INTERNAL FIXATION (ORIF) ANKLE FRACTURE;  Surgeon: Murleen Arms, MD;  Location: [facility] OR;  Service: Orthopedics;  Laterality: Right;    FAMILY HISTORY: Family History  Problem Relation Age of Onset    Bipolar disorder Mother    Breast cancer Paternal Grandmother    Stroke Paternal Grandmother     SOCIAL HISTORY: Social History   Socioeconomic History   Marital status: Single    Spouse name: Not on file   Number of children: Not on file   Years of education: Not on file   Highest education level: Not on file  Occupational History   Not on file  Tobacco Use   Smoking status: Never   Smokeless tobacco: Never  Vaping Use   Vaping status: Never Used  Substance and Sexual Activity   Alcohol use: Yes    Comment: socially - wine   Drug use:  No   Sexual activity: Never    Birth control/protection: None  Other Topics Concern   Not on file  Social History Narrative   Right handed    Drinks one cup coffee per day   Drinks soda one per day    Social Drivers of Health   Financial Resource Strain: Not on file  Food Insecurity: Not on file  Transportation Needs: Not on file  Physical Activity: Not on file  Stress: Not on file  Social Connections: Not on file  Intimate Partner Violence: Not on file    PHYSICAL EXAM  Vitals:   02/17/24 0931 02/17/24 0942  BP:  132/85  Pulse:  89  Weight: (!) 418 lb 6.4 oz (189.8 kg)   Height: 5\' 5"  (1.651 m)    Body mass index is 69.63 kg/m.  Generalized: Well developed, in no acute distress  Neurological examination  Mentation: Alert oriented to time, place, history taking. Follows all commands speech and language fluent Cranial nerve II-XII: Pupils were equal round reactive to light.  Difficult time evaluating for optic disc edema.  Extraocular movements were full, visual field were full on confrontational test. Facial sensation and strength were normal. Head turning and shoulder shrug  were normal and symmetric. Motor: The motor testing reveals 5 over 5 strength of all 4 extremities. Good symmetric motor tone is noted throughout.  Sensory: Sensory testing is intact to soft touch on all 4 extremities. No evidence of extinction is  noted.  Coordination: Cerebellar testing reveals good finger-nose-finger and heel-to-shin bilaterally.  Gait and station: Gait is normal.   DIAGNOSTIC DATA (LABS, IMAGING, TESTING) - I reviewed patient records, labs, notes, testing and imaging myself where available.  Lab Results  Component Value Date   WBC 8.5 09/11/2021   HGB 12.9 09/11/2021   HCT 41.6 09/11/2021   MCV 79.8 (L) 09/11/2021   PLT 303 09/11/2021   No results found for: "NA", "K", "CL", "CO2", "GLUCOSE", "BUN", "CREATININE", "CALCIUM", "PROT", "ALBUMIN", "AST", "ALT", "ALKPHOS", "BILITOT", "GFRNONAA", "GFRAA" No results found for: "CHOL", "HDL", "LDLCALC", "LDLDIRECT", "TRIG", "CHOLHDL" No results found for: "HGBA1C" No results found for: "VITAMINB12" No results found for: "TSH"  Beverly Hawkins, AGNP-C, DNP 02/17/2024, 9:55 AM [facility] [address] [place], [place] [address] [phone]

## 2024-02-17 ENCOUNTER — Ambulatory Visit: Admitting: Neurology

## 2024-02-17 VITALS — BP 132/85 | HR 89 | Ht 65.0 in | Wt >= 6400 oz

## 2024-02-17 DIAGNOSIS — H4710 Unspecified papilledema: Secondary | ICD-10-CM | POA: Diagnosis not present

## 2024-02-17 DIAGNOSIS — G43009 Migraine without aura, not intractable, without status migrainosus: Secondary | ICD-10-CM

## 2024-02-17 NOTE — Patient Instructions (Addendum)
 Referral to Ophthalmology Dr. Dione Booze try to get soon appointment  Let me know if you have any headaches, vision changes Highly recommend work on weight loss Continue your CPAP, follow up in a few weeks for CPAP   Dr. Dione [facility]  [address] [place], [place] [address] [phone]

## 2024-02-20 ENCOUNTER — Telehealth: Payer: Self-pay | Admitting: Neurology

## 2024-02-20 NOTE — Telephone Encounter (Signed)
 Referral for ophthalmology fax to [facility]. Phone: [phone], Fax: [phone]

## 2024-02-23 ENCOUNTER — Telehealth: Payer: Self-pay | Admitting: Neurology

## 2024-02-23 NOTE — Telephone Encounter (Signed)
 Call to patient, she reports having an appointment with Dr. Dione Booze on 4/9

## 2024-02-23 NOTE — Telephone Encounter (Signed)
 Please call to see if she was able to get scheduled to see Dr. Dione Booze? I had recommended we proceed with LP. However, her preference was to hold off until seeing ophthalmology.

## 2024-04-23 ENCOUNTER — Ambulatory Visit: Payer: Medicaid Other | Admitting: Adult Health

## 2024-04-23 ENCOUNTER — Ambulatory Visit: Admitting: Neurology

## 2024-05-08 ENCOUNTER — Encounter: Payer: Self-pay | Admitting: Neurology

## 2024-05-08 ENCOUNTER — Ambulatory Visit: Admitting: Neurology

## 2024-05-08 DIAGNOSIS — G4733 Obstructive sleep apnea (adult) (pediatric): Secondary | ICD-10-CM

## 2024-05-08 DIAGNOSIS — G43009 Migraine without aura, not intractable, without status migrainosus: Secondary | ICD-10-CM

## 2024-05-08 DIAGNOSIS — H4710 Unspecified papilledema: Secondary | ICD-10-CM | POA: Insufficient documentation

## 2024-05-08 HISTORY — DX: Obstructive sleep apnea (adult) (pediatric): G47.33

## 2024-05-08 NOTE — Patient Instructions (Signed)
 Continue CPAP nightly for minimum 4 hours, continue current settings. Follow up with Dr. Candi Chafe, let me know after and I can get the records. Call for any headaches or vision changes. Keep working on weight loss. Follow up in 6 months

## 2024-05-08 NOTE — Progress Notes (Addendum)
 Patient: Beverly Hawkins Date of Birth: 2001/03/11  Reason for Visit: Follow up History from: Patient Primary Neurologist: Sater   ASSESSMENT AND PLAN 23 y.o. year old female   1.  Chronic migraine headache 2.  Optic nerve edema 3.  Morbid obesity 4.  OSA, severe on CPAP  -Denies any headache or vision change -Doing very well on CPAP.  Encouraged to continue to use nightly minimum 4 hours.  We will continue current settings. -She will continue to work on weight loss.  She has already lost about 20 lbs going to the wellness center. -Follow-up with Dr. Candi Chafe next week, saw Dr. Candi Chafe back in April, suspicious findings did not represent true papilledema. She has not wanted to pursue LP, will continue to re-evaluate necessity  -MRI of the brain was unrevealing with the exception of sinusitis -Follow-up with me in about 6 months, call for any headache or vision change  Addendum 03/28/24 SS: I received ophthalmology evaluation from Dr. Candi Chafe 03/14/24, possible mild disc edema nasally bilaterally.  Nerves do not appear swollen on visual inspection.  OCT is equivocal with possible mild disc swelling bilaterally versus normal variant.  Suspicion that this does not represent true papilledema.  Recommended repeating OCT in a few months.  I called the patient, she denies headache or vision change.  Has not had any indication of a headache in 2 months.  She is closely following with a weight loss clinic, has made several healthy lifestyle changes.  She has just started [facility].  She prefers to hold off on lumbar puncture, I discussed symptoms of IIH.  She will keep follow-up with ophthalmology.  Stay in touch.  Addendum 05/21/24 SS: I reviewed notes from Dr. Candi Chafe 05/16/2024 exam was stable.  Recommend continued surveillance.  Follow-up in 6 months.  No disc edema.  No disc pallor.  No cup.  Sharp margins 360.  No SVP's (difficult to assess).  Suspicion is that this probably does not represent true papilledema  however she has a body habitus, no spontaneous pulsations and the nerve appearance is equivocal.  If true papilledema would expect OCT to fluctuate over time.  HISTORY OF PRESENT ILLNESS: Today 05/08/24 Here for initial CPAP, setup 02/13/24. HST Dec 2024 showed severe OSA.  Compliance report last 85 days shows 95% usage, > 4 hours 71%, 4-20 cm water. Leak 34, AHI 0.8. She is doing well with CPAP. Sleeps better, easier to fall asleep, feels more rested, more energy. A few nights it may become uncomfortable and take it off. Uses [facility].   Continues going to weight loss clinic, is on [place], has lost 20 lbs. Planning to see Dr. Candi Chafe 6/11 for re-evaluation of papilledema. No headaches or vision change.   02/17/23 SS: Saw Dr. Godwin Lat November 2024 felt to be at risk for IIH due to high BMI.  MRI of the brain was ordered.  Sleep study.  HST in December 2024 showed severe OSA, recommended starting CPAP.  MRI of the brain with and without contrast showed sinusitis.  Azithromycin  was sent in. Looks like she just started CPAP 02/13/24. Mentions haven't had any headaches frequently. Has only taken Imitrex once since last seen. Going to weight loss clinic next week for initial visit. Not clear she ever started the Topamax . Has not been back to see eye doctor. No vision changes, no dizziness. Her current weight is 418 BMI is 69.   IMPRESSION: This MRI of the brain with and without contrast shows the following: The brain  appears normal before and after contrast. Maxillary, ethmoid, right frontal and sphenoid sinusitis with mixed acute and chronic features. Normal enhancement pattern.  HISTORY  10/19/23 Dr. Godwin Lat: I had the pleasure seeing your patient, Beverly Hawkins, at [facility] for neurologic consultation regarding her and headaches   She is a 23 year old woman who has a long history of migraine headaches.  These used to occur more frequently but recently, most months she has 2-3 migraines.   However, last month she had a migraine lasting 3 days.    With a migraine she has mild nausea but no vomiting.  She has photophobia and phonophobia.    Moving the head fast worsens the pain.    When a migraine occurs, she usually toughs it out or takes an OTC analgesic.  If able to nap, the migraine is almost always resolved upon awakening.       She recently was started on topiramate  (2 weeks ago).  Too early to see if there is a benefit.   She also was prescribed sumatriptan.  She has tried it once with success - knocking the HA out in under 30 minutes.      She saw optometry due to the 3 day headache and photosensitivity.  She was noted to have blurred disc margins bilaterally.  Also had mild dry eyes but was otherwise normal.      She feels her weight is stable over the last year.   She weights 404 pounds and has a BMI = 69.4.     She snores loudly at night but has never been told she has gasping, pauses or snorting.   She does not report much excessive daytime sleepiness.   REVIEW OF SYSTEMS: Out of a complete 14 system review of symptoms, the patient complains only of the following symptoms, and all other reviewed systems are negative.  See HPI  ALLERGIES: Allergies  Allergen Reactions   Benadryl  [Diphenhydramine ]     Causes finger tips to have small bumps   Latex Itching    Unsure of reaction    HOME MEDICATIONS: Outpatient Medications Prior to Visit  Medication Sig Dispense Refill   Semaglutide-Weight Management 1 MG/0.5ML SOAJ Inject 1 mg into the skin.     IMITREX 25 MG tablet Take 25 mg by mouth every 2 (two) hours as needed for migraine.     No facility-administered medications prior to visit.    PAST MEDICAL HISTORY: Past Medical History:  Diagnosis Date   Headache(784.0)    Obesity    Reflux gastritis    Walker as ambulation aid    and crutches    PAST SURGICAL HISTORY: Past Surgical History:  Procedure Laterality Date   MANIPULATION ANKLE Right    with  sedation   ORIF ANKLE FRACTURE Right 09/11/2021   Procedure: OPEN REDUCTION INTERNAL FIXATION (ORIF) ANKLE FRACTURE;  Surgeon: Murleen Arms, MD;  Location: [facility] OR;  Service: Orthopedics;  Laterality: Right;    FAMILY HISTORY: Family History  Problem Relation Age of Onset   Bipolar disorder Mother    Breast cancer Paternal Grandmother    Stroke Paternal Grandmother     SOCIAL HISTORY: Social History   Socioeconomic History   Marital status: Single    Spouse name: Not on file   Number of children: Not on file   Years of education: Not on file   Highest education level: Not on file  Occupational History   Not on file  Tobacco Use  Smoking status: Never   Smokeless tobacco: Never  Vaping Use   Vaping status: Never Used  Substance and Sexual Activity   Alcohol use: Yes    Comment: socially - wine   Drug use: No   Sexual activity: Never    Birth control/protection: None  Other Topics Concern   Not on file  Social History Narrative   Right handed    Drinks one cup coffee per day   Drinks soda one per day    Social Drivers of Corporate investment banker Strain: Not on file  Food Insecurity: Not on file  Transportation Needs: Not on file  Physical Activity: Not on file  Stress: Not on file  Social Connections: Not on file  Intimate Partner Violence: Not on file   PHYSICAL EXAM  Vitals:   05/08/24 1409  BP: 130/80  Weight: (!) 407 lb (184.6 kg)  Height: 5' 4 (1.626 m)   Body mass index is 69.86 kg/m.  Generalized: Well developed, in no acute distress  Neurological examination  Mentation: Alert oriented to time, place, history taking. Follows all commands speech and language fluent Cranial nerve II-XII: Pupils were equal round reactive to light.  Extraocular movements were full, visual field were full on confrontational test. Facial sensation and strength were normal. Head turning and shoulder shrug  were normal and symmetric. Motor: The motor testing  reveals 5 over 5 strength of all 4 extremities. Good symmetric motor tone is noted throughout.  Sensory: Sensory testing is intact to soft touch on all 4 extremities. No evidence of extinction is noted.  Coordination: Cerebellar testing reveals good finger-nose-finger and heel-to-shin bilaterally.  Gait and station: Gait is normal.   DIAGNOSTIC DATA (LABS, IMAGING, TESTING) - I reviewed patient records, labs, notes, testing and imaging myself where available.  Lab Results  Component Value Date   WBC 8.5 09/11/2021   HGB 12.9 09/11/2021   HCT 41.6 09/11/2021   MCV 79.8 (L) 09/11/2021   PLT 303 09/11/2021   No results found for: NA, K, CL, CO2, GLUCOSE, BUN, CREATININE, CALCIUM, PROT, ALBUMIN, AST, ALT, ALKPHOS, BILITOT, GFRNONAA, GFRAA No results found for: CHOL, HDL, LDLCALC, LDLDIRECT, TRIG, CHOLHDL No results found for: ZHYQ6V No results found for: VITAMINB12 No results found for: TSH  Jeanmarie Millet, AGNP-C, DNP 05/08/2024, 2:50 PM [facility] [address], [address] [place], [place] [address] [phone]

## 2024-10-09 ENCOUNTER — Telehealth: Payer: Self-pay | Admitting: *Deleted

## 2024-11-26 NOTE — Progress Notes (Unsigned)
 SABRA

## 2024-11-27 ENCOUNTER — Ambulatory Visit: Admitting: Neurology

## 2024-11-27 ENCOUNTER — Encounter: Payer: Self-pay | Admitting: Neurology

## 2024-11-27 VITALS — BP 126/74 | HR 83 | Ht 66.0 in | Wt >= 6400 oz

## 2024-11-27 DIAGNOSIS — G4733 Obstructive sleep apnea (adult) (pediatric): Secondary | ICD-10-CM

## 2024-11-27 DIAGNOSIS — H4710 Unspecified papilledema: Secondary | ICD-10-CM | POA: Diagnosis not present

## 2024-11-27 NOTE — Progress Notes (Signed)
 "  Patient: Beverly Hawkins Date of Birth: 2001/06/10  Reason for Visit: Follow up History from: Patient Primary Neurologist: Sater   ASSESSMENT AND PLAN 23 y.o. year old female   1.  Chronic migraine headache 2.  Optic nerve edema 3.  Morbid obesity 4.  OSA, severe on CPAP  -Denies any headache or vision change -Ophthalmology evaluation with Dr. Octavia has been reassuring.  Suspicious that findings did not represent true papilledema -Recommend continue nightly CPAP use minimum 4 hours.  Continue current settings.  AHI very well treated at 0.1 -She will continue to work on weight loss. Unfortunately gained some weight back, started on Zepbound soon.  -MRI of the brain was unrevealing with the exception of sinusitis -Follow-up with me in about 6 months, call for any headache or vision change  Addendum 03/28/24 SS: I received ophthalmology evaluation from Dr. Octavia 03/14/24, possible mild disc edema nasally bilaterally.  Nerves do not appear swollen on visual inspection.  OCT is equivocal with possible mild disc swelling bilaterally versus normal variant.  Suspicion that this does not represent true papilledema.  Recommended repeating OCT in a few months.  I called the patient, she denies headache or vision change.  Has not had any indication of a headache in 2 months.  She is closely following with a weight loss clinic, has made several healthy lifestyle changes.  She has just started [facility].  She prefers to hold off on lumbar puncture, I discussed symptoms of IIH.  She will keep follow-up with ophthalmology.  Stay in touch.  Addendum 05/21/24 SS: I reviewed notes from Dr. Octavia 05/16/2024 exam was stable.  Recommend continued surveillance.  Follow-up in 6 months.  No disc edema.  No disc pallor.  No cup.  Sharp margins 360.  No SVP's (difficult to assess).  Suspicion is that this probably does not represent true papilledema however she has a body habitus, no spontaneous pulsations and the nerve  appearance is equivocal.  If true papilledema would expect OCT to fluctuate over time.  HISTORY OF PRESENT ILLNESS: Today 11/27/2024 11/27/24 SS: CPAP report shows 77% compliance greater than 4 hours, 4-20 cm water, AHI 0.1, leak 21.9. uses FFM. Sleeping well, feeling well rested during the day. Issues with GL[employer] insurance coverage, weight is back up 10 lbs, going to start Zepbound this week. Weight is 421 lbs. No headaches or vision change. Saw Dr. Octavia this month, stable, follow up in 6 months. Combination of working extra plus loss of Wegovy increased her weight gain.  05/08/24 SS: Here for initial CPAP, setup 02/13/24. HST Dec 2024 showed severe OSA.  Compliance report last 85 days shows 95% usage, > 4 hours 71%, 4-20 cm water. Leak 34, AHI 0.8. She is doing well with CPAP. Sleeps better, easier to fall asleep, feels more rested, more energy. A few nights it may become uncomfortable and take it off. Uses FFM.   Continues going to weight loss clinic, is on Wegovy, has lost 20 lbs. Planning to see Dr. Octavia 6/11 for re-evaluation of papilledema. No headaches or vision change.   02/17/23 SS: Saw Dr. Vear November 2024 felt to be at risk for IIH due to high BMI.  MRI of the brain was ordered.  Sleep study.  HST in December 2024 showed severe OSA, recommended starting CPAP.  MRI of the brain with and without contrast showed sinusitis.  Azithromycin  was sent in. Looks like she just started CPAP 02/13/24. Mentions haven't had any headaches frequently. Has only  taken Imitrex once since last seen. Going to weight loss clinic next week for initial visit. Not clear she ever started the Topamax . Has not been back to see eye doctor. No vision changes, no dizziness. Her current weight is 418 BMI is 69.   IMPRESSION: This MRI of the brain with and without contrast shows the following: The brain appears normal before and after contrast. Maxillary, ethmoid, right frontal and sphenoid sinusitis with mixed acute and  chronic features. Normal enhancement pattern.  HISTORY  10/19/23 Dr. Vear: I had the pleasure seeing your patient, Beverly Hawkins, at [facility] for neurologic consultation regarding her and headaches   She is a 23 year old woman who has a long history of migraine headaches.  These used to occur more frequently but recently, most months she has 2-3 migraines.  However, last month she had a migraine lasting 3 days.    With a migraine she has mild nausea but no vomiting.  She has photophobia and phonophobia.    Moving the head fast worsens the pain.    When a migraine occurs, she usually toughs it out or takes an OTC analgesic.  If able to nap, the migraine is almost always resolved upon awakening.       She recently was started on topiramate  (2 weeks ago).  Too early to see if there is a benefit.   She also was prescribed sumatriptan.  She has tried it once with success - knocking the HA out in under 30 minutes.      She saw optometry due to the 3 day headache and photosensitivity.  She was noted to have blurred disc margins bilaterally.  Also had mild dry eyes but was otherwise normal.      She feels her weight is stable over the last year.   She weights 404 pounds and has a BMI = 69.4.     She snores loudly at night but has never been told she has gasping, pauses or snorting.   She does not report much excessive daytime sleepiness.   REVIEW OF SYSTEMS: Out of a complete 14 system review of symptoms, the patient complains only of the following symptoms, and all other reviewed systems are negative.  See HPI  ALLERGIES: Allergies  Allergen Reactions   Benadryl  [Diphenhydramine ]     Causes finger tips to have small bumps   Latex Itching and Other (See Comments)    Unsure of reaction  Other Reaction(s): Unknown    HOME MEDICATIONS: Outpatient Medications Prior to Visit  Medication Sig Dispense Refill   ZEPBOUND 2.5 MG/0.5ML Pen Inject 2.5 mg into the skin once a week.      IMITREX 25 MG tablet Take 25 mg by mouth every 2 (two) hours as needed for migraine.     Semaglutide-Weight Management 1 MG/0.5ML SOAJ Inject 1 mg into the skin.     No facility-administered medications prior to visit.    PAST MEDICAL HISTORY: Past Medical History:  Diagnosis Date   Headache(784.0)    Obesity    OSA on CPAP 05/08/2024   Reflux gastritis    Walker as ambulation aid    and crutches    PAST SURGICAL HISTORY: Past Surgical History:  Procedure Laterality Date   MANIPULATION ANKLE Right    with sedation   ORIF ANKLE FRACTURE Right 09/11/2021   Procedure: OPEN REDUCTION INTERNAL FIXATION (ORIF) ANKLE FRACTURE;  Surgeon: Edna Toribio LABOR, MD;  Location: [facility] OR;  Service: Orthopedics;  Laterality: Right;  FAMILY HISTORY: Family History  Problem Relation Age of Onset   Bipolar disorder Mother    Breast cancer Paternal Grandmother    Stroke Paternal Grandmother     SOCIAL HISTORY: Social History   Socioeconomic History   Marital status: Single    Spouse name: Not on file   Number of children: Not on file   Years of education: Not on file   Highest education level: Not on file  Occupational History   Not on file  Tobacco Use   Smoking status: Never   Smokeless tobacco: Never  Vaping Use   Vaping status: Never Used  Substance and Sexual Activity   Alcohol use: Yes    Comment: socially - wine   Drug use: No   Sexual activity: Never    Birth control/protection: None  Other Topics Concern   Not on file  Social History Narrative   Right handed    Drinks one cup coffee per day   Drinks soda one per day    Social Drivers of Health   Tobacco Use: Low Risk (11/27/2024)   Patient History    Smoking Tobacco Use: Never    Smokeless Tobacco Use: Never    Passive Exposure: Not on file  Financial Resource Strain: Not on file  Food Insecurity: Not on file  Transportation Needs: Not on file  Physical Activity: Not on file  Stress: Not on file   Social Connections: Not on file  Intimate Partner Violence: Not on file  Depression (EYV7-0): Not on file  Alcohol Screen: Not on file  Housing: Not on file  Utilities: Not on file  Health Literacy: Not on file   PHYSICAL EXAM  Vitals:   11/27/24 1056  BP: 126/74  Pulse: 83  Weight: (!) 421 lb 8 oz (191.2 kg)  Height: 5' 6 (1.676 m)   Body mass index is 68.03 kg/m.  Generalized: Well developed, in no acute distress  Neurological examination  Mentation: Alert oriented to time, place, history taking. Follows all commands speech and language fluent Cranial nerve II-XII: Pupils were equal round reactive to light.  Extraocular movements were full, visual field were full on confrontational test. Facial sensation and strength were normal. Head turning and shoulder shrug  were normal and symmetric. Motor: The motor testing reveals 5 over 5 strength of all 4 extremities. Good symmetric motor tone is noted throughout.  Sensory: Sensory testing is intact to soft touch on all 4 extremities. No evidence of extinction is noted.  Coordination: Cerebellar testing reveals good finger-nose-finger and heel-to-shin bilaterally.  Gait and station: Gait is normal.   DIAGNOSTIC DATA (LABS, IMAGING, TESTING) - I reviewed patient records, labs, notes, testing and imaging myself where available.  Lab Results  Component Value Date   WBC 8.5 09/11/2021   HGB 12.9 09/11/2021   HCT 41.6 09/11/2021   MCV 79.8 (L) 09/11/2021   PLT 303 09/11/2021   No results found for: NA, K, CL, CO2, GLUCOSE, BUN, CREATININE, CALCIUM, PROT, ALBUMIN, AST, ALT, ALKPHOS, BILITOT, GFRNONAA, GFRAA No results found for: CHOL, HDL, LDLCALC, LDLDIRECT, TRIG, CHOLHDL No results found for: YHAJ8R No results found for: VITAMINB12 No results found for: TSH  Lauraine Born, AGNP-C, DNP 11/27/2024, 11:29 AM [facility] [address], [address] [place], [place]  [address] [phone]  "

## 2024-11-27 NOTE — Patient Instructions (Signed)
 Great to see you today! Continue CPAP use minimum 4 hours Continue to work on weight loss Keep routine follow-up with ophthalmology Call for worsening headache or vision change Follow-up 6 months.  Thanks!!

## 2025-06-20 ENCOUNTER — Ambulatory Visit: Admitting: Neurology
